# Patient Record
Sex: Male | Born: 1965 | Race: White | Hispanic: No | Marital: Single | Smoking: Never smoker
Health system: Southern US, Community
[De-identification: ages and names within clinical notes are randomized; demographics above are authoritative.]

## PROBLEM LIST (undated history)

## (undated) DIAGNOSIS — K649 Unspecified hemorrhoids: Secondary | ICD-10-CM

## (undated) DIAGNOSIS — I10 Essential (primary) hypertension: Secondary | ICD-10-CM

## (undated) DIAGNOSIS — A048 Other specified bacterial intestinal infections: Secondary | ICD-10-CM

## (undated) DIAGNOSIS — N200 Calculus of kidney: Secondary | ICD-10-CM

## (undated) DIAGNOSIS — F4310 Post-traumatic stress disorder, unspecified: Secondary | ICD-10-CM

## (undated) DIAGNOSIS — F32A Depression, unspecified: Secondary | ICD-10-CM

## (undated) DIAGNOSIS — E785 Hyperlipidemia, unspecified: Secondary | ICD-10-CM

## (undated) DIAGNOSIS — K602 Anal fissure, unspecified: Secondary | ICD-10-CM

## (undated) DIAGNOSIS — K219 Gastro-esophageal reflux disease without esophagitis: Secondary | ICD-10-CM

## (undated) DIAGNOSIS — K279 Peptic ulcer, site unspecified, unspecified as acute or chronic, without hemorrhage or perforation: Secondary | ICD-10-CM

## (undated) DIAGNOSIS — F329 Major depressive disorder, single episode, unspecified: Secondary | ICD-10-CM

## (undated) DIAGNOSIS — D126 Benign neoplasm of colon, unspecified: Secondary | ICD-10-CM

## (undated) DIAGNOSIS — M1990 Unspecified osteoarthritis, unspecified site: Secondary | ICD-10-CM

## (undated) DIAGNOSIS — J189 Pneumonia, unspecified organism: Secondary | ICD-10-CM

## (undated) DIAGNOSIS — M6282 Rhabdomyolysis: Secondary | ICD-10-CM

## (undated) HISTORY — PX: SHOULDER SURGERY: SHX246

## (undated) HISTORY — PX: SPHINCTEROTOMY: SHX5279

## (undated) HISTORY — DX: Hyperlipidemia, unspecified: E78.5

## (undated) HISTORY — DX: Benign neoplasm of colon, unspecified: D12.6

## (undated) HISTORY — PX: NASAL SINUS SURGERY: SHX719

## (undated) HISTORY — PX: HEMORRHOID SURGERY: SHX153

## (undated) HISTORY — DX: Calculus of kidney: N20.0

## (undated) HISTORY — DX: Unspecified osteoarthritis, unspecified site: M19.90

## (undated) HISTORY — DX: Pneumonia, unspecified organism: J18.9

## (undated) HISTORY — DX: Anal fissure, unspecified: K60.2

## (undated) HISTORY — DX: Post-traumatic stress disorder, unspecified: F43.10

## (undated) HISTORY — DX: Unspecified hemorrhoids: K64.9

## (undated) HISTORY — DX: Rhabdomyolysis: M62.82

## (undated) HISTORY — DX: Major depressive disorder, single episode, unspecified: F32.9

## (undated) HISTORY — DX: Depression, unspecified: F32.A

## (undated) HISTORY — DX: Essential (primary) hypertension: I10

## (undated) HISTORY — DX: Peptic ulcer, site unspecified, unspecified as acute or chronic, without hemorrhage or perforation: K27.9

## (undated) HISTORY — DX: Other specified bacterial intestinal infections: A04.8

---

## 1997-03-08 DIAGNOSIS — I219 Acute myocardial infarction, unspecified: Secondary | ICD-10-CM

## 1997-03-08 HISTORY — DX: Acute myocardial infarction, unspecified: I21.9

## 1997-06-19 ENCOUNTER — Ambulatory Visit (HOSPITAL_COMMUNITY): Admission: RE | Admit: 1997-06-19 | Discharge: 1997-06-19 | Payer: Self-pay | Admitting: *Deleted

## 1997-08-01 ENCOUNTER — Emergency Department (HOSPITAL_COMMUNITY): Admission: EM | Admit: 1997-08-01 | Discharge: 1997-08-01 | Payer: Self-pay | Admitting: Emergency Medicine

## 1998-02-02 ENCOUNTER — Emergency Department (HOSPITAL_COMMUNITY): Admission: EM | Admit: 1998-02-02 | Discharge: 1998-02-02 | Payer: Self-pay | Admitting: Emergency Medicine

## 1998-02-02 ENCOUNTER — Encounter: Payer: Self-pay | Admitting: Emergency Medicine

## 1998-03-28 ENCOUNTER — Emergency Department (HOSPITAL_COMMUNITY): Admission: EM | Admit: 1998-03-28 | Discharge: 1998-03-28 | Payer: Self-pay | Admitting: Emergency Medicine

## 1999-02-12 ENCOUNTER — Ambulatory Visit (HOSPITAL_BASED_OUTPATIENT_CLINIC_OR_DEPARTMENT_OTHER): Admission: RE | Admit: 1999-02-12 | Discharge: 1999-02-12 | Payer: Self-pay | Admitting: Plastic Surgery

## 1999-04-10 ENCOUNTER — Encounter: Admission: RE | Admit: 1999-04-10 | Discharge: 1999-07-09 | Payer: Self-pay | Admitting: *Deleted

## 1999-04-14 ENCOUNTER — Emergency Department (HOSPITAL_COMMUNITY): Admission: EM | Admit: 1999-04-14 | Discharge: 1999-04-14 | Payer: Self-pay | Admitting: Emergency Medicine

## 1999-04-14 ENCOUNTER — Encounter: Payer: Self-pay | Admitting: Emergency Medicine

## 1999-09-28 ENCOUNTER — Encounter (INDEPENDENT_AMBULATORY_CARE_PROVIDER_SITE_OTHER): Payer: Self-pay | Admitting: *Deleted

## 1999-09-28 ENCOUNTER — Ambulatory Visit (HOSPITAL_BASED_OUTPATIENT_CLINIC_OR_DEPARTMENT_OTHER): Admission: RE | Admit: 1999-09-28 | Discharge: 1999-09-29 | Payer: Self-pay | Admitting: Otolaryngology

## 2000-01-17 ENCOUNTER — Encounter: Payer: Self-pay | Admitting: Emergency Medicine

## 2000-01-17 ENCOUNTER — Emergency Department (HOSPITAL_COMMUNITY): Admission: EM | Admit: 2000-01-17 | Discharge: 2000-01-17 | Payer: Self-pay | Admitting: Emergency Medicine

## 2000-10-21 ENCOUNTER — Ambulatory Visit (HOSPITAL_COMMUNITY): Admission: RE | Admit: 2000-10-21 | Discharge: 2000-10-21 | Payer: Self-pay | Admitting: Orthopedic Surgery

## 2000-10-21 ENCOUNTER — Encounter: Payer: Self-pay | Admitting: Orthopedic Surgery

## 2001-10-08 ENCOUNTER — Emergency Department (HOSPITAL_COMMUNITY): Admission: EM | Admit: 2001-10-08 | Discharge: 2001-10-08 | Payer: Self-pay

## 2002-03-14 ENCOUNTER — Encounter: Payer: Self-pay | Admitting: Internal Medicine

## 2002-03-14 ENCOUNTER — Inpatient Hospital Stay (HOSPITAL_COMMUNITY): Admission: AD | Admit: 2002-03-14 | Discharge: 2002-03-16 | Payer: Self-pay | Admitting: Internal Medicine

## 2002-05-01 ENCOUNTER — Ambulatory Visit (HOSPITAL_BASED_OUTPATIENT_CLINIC_OR_DEPARTMENT_OTHER): Admission: RE | Admit: 2002-05-01 | Discharge: 2002-05-02 | Payer: Self-pay | Admitting: Surgery

## 2002-12-24 ENCOUNTER — Ambulatory Visit (HOSPITAL_BASED_OUTPATIENT_CLINIC_OR_DEPARTMENT_OTHER): Admission: RE | Admit: 2002-12-24 | Discharge: 2002-12-24 | Payer: Self-pay | Admitting: Plastic Surgery

## 2002-12-24 ENCOUNTER — Ambulatory Visit (HOSPITAL_COMMUNITY): Admission: RE | Admit: 2002-12-24 | Discharge: 2002-12-24 | Payer: Self-pay | Admitting: Plastic Surgery

## 2002-12-24 ENCOUNTER — Encounter (INDEPENDENT_AMBULATORY_CARE_PROVIDER_SITE_OTHER): Payer: Self-pay | Admitting: Specialist

## 2003-05-19 ENCOUNTER — Emergency Department (HOSPITAL_COMMUNITY): Admission: EM | Admit: 2003-05-19 | Discharge: 2003-05-20 | Payer: Self-pay | Admitting: Emergency Medicine

## 2004-02-06 ENCOUNTER — Ambulatory Visit (HOSPITAL_COMMUNITY): Admission: RE | Admit: 2004-02-06 | Discharge: 2004-02-06 | Payer: Self-pay | Admitting: Internal Medicine

## 2004-02-15 ENCOUNTER — Emergency Department (HOSPITAL_COMMUNITY): Admission: EM | Admit: 2004-02-15 | Discharge: 2004-02-15 | Payer: Self-pay | Admitting: Family Medicine

## 2005-07-31 ENCOUNTER — Emergency Department (HOSPITAL_COMMUNITY): Admission: EM | Admit: 2005-07-31 | Discharge: 2005-07-31 | Payer: Self-pay | Admitting: Emergency Medicine

## 2006-07-27 ENCOUNTER — Encounter: Admission: RE | Admit: 2006-07-27 | Discharge: 2006-07-27 | Payer: Self-pay | Admitting: Orthopedic Surgery

## 2006-08-16 ENCOUNTER — Encounter: Admission: RE | Admit: 2006-08-16 | Discharge: 2006-08-16 | Payer: Self-pay | Admitting: Neurological Surgery

## 2007-03-14 ENCOUNTER — Ambulatory Visit: Payer: Self-pay | Admitting: Gastroenterology

## 2007-03-14 LAB — CONVERTED CEMR LAB
AST: 24 units/L (ref 0–37)
Albumin: 3.9 g/dL (ref 3.5–5.2)
Alkaline Phosphatase: 52 units/L (ref 39–117)
BUN: 10 mg/dL (ref 6–23)
Basophils Absolute: 0.1 10*3/uL (ref 0.0–0.1)
Chloride: 105 meq/L (ref 96–112)
Creatinine, Ser: 1 mg/dL (ref 0.4–1.5)
MCHC: 34 g/dL (ref 30.0–36.0)
Monocytes Relative: 9.1 % (ref 3.0–11.0)
Platelets: 309 10*3/uL (ref 150–400)
Potassium: 4.2 meq/L (ref 3.5–5.1)
RBC: 4.77 M/uL (ref 4.22–5.81)
RDW: 13.6 % (ref 11.5–14.6)
Total Bilirubin: 0.6 mg/dL (ref 0.3–1.2)

## 2007-03-16 ENCOUNTER — Ambulatory Visit: Payer: Self-pay | Admitting: Gastroenterology

## 2007-03-16 ENCOUNTER — Encounter: Payer: Self-pay | Admitting: Gastroenterology

## 2007-03-16 ENCOUNTER — Ambulatory Visit (HOSPITAL_COMMUNITY): Admission: RE | Admit: 2007-03-16 | Discharge: 2007-03-17 | Payer: Self-pay | Admitting: Gastroenterology

## 2007-03-29 ENCOUNTER — Ambulatory Visit: Payer: Self-pay | Admitting: Gastroenterology

## 2007-04-23 ENCOUNTER — Ambulatory Visit: Payer: Self-pay | Admitting: Cardiology

## 2007-04-23 ENCOUNTER — Inpatient Hospital Stay (HOSPITAL_COMMUNITY): Admission: EM | Admit: 2007-04-23 | Discharge: 2007-04-26 | Payer: Self-pay | Admitting: Emergency Medicine

## 2007-04-24 ENCOUNTER — Encounter: Payer: Self-pay | Admitting: Cardiology

## 2007-04-27 ENCOUNTER — Observation Stay (HOSPITAL_COMMUNITY): Admission: EM | Admit: 2007-04-27 | Discharge: 2007-04-28 | Payer: Self-pay | Admitting: Emergency Medicine

## 2007-06-09 ENCOUNTER — Emergency Department (HOSPITAL_COMMUNITY): Admission: EM | Admit: 2007-06-09 | Discharge: 2007-06-09 | Payer: Self-pay | Admitting: Family Medicine

## 2008-02-03 ENCOUNTER — Ambulatory Visit: Payer: Self-pay | Admitting: Internal Medicine

## 2008-02-03 ENCOUNTER — Inpatient Hospital Stay (HOSPITAL_COMMUNITY): Admission: EM | Admit: 2008-02-03 | Discharge: 2008-02-06 | Payer: Self-pay | Admitting: Emergency Medicine

## 2008-03-21 ENCOUNTER — Ambulatory Visit: Payer: Self-pay | Admitting: Cardiovascular Disease

## 2008-04-02 ENCOUNTER — Telehealth: Payer: Self-pay | Admitting: Gastroenterology

## 2008-04-03 ENCOUNTER — Ambulatory Visit: Payer: Self-pay | Admitting: Internal Medicine

## 2008-04-03 LAB — CONVERTED CEMR LAB
CO2: 31 meq/L (ref 19–32)
Calcium: 9.2 mg/dL (ref 8.4–10.5)
Glucose, Bld: 106 mg/dL — ABNORMAL HIGH (ref 70–99)
Sodium: 141 meq/L (ref 135–145)

## 2008-06-10 DIAGNOSIS — K279 Peptic ulcer, site unspecified, unspecified as acute or chronic, without hemorrhage or perforation: Secondary | ICD-10-CM | POA: Insufficient documentation

## 2008-06-10 DIAGNOSIS — I10 Essential (primary) hypertension: Secondary | ICD-10-CM | POA: Insufficient documentation

## 2008-06-10 DIAGNOSIS — F329 Major depressive disorder, single episode, unspecified: Secondary | ICD-10-CM | POA: Insufficient documentation

## 2008-06-10 DIAGNOSIS — M6282 Rhabdomyolysis: Secondary | ICD-10-CM | POA: Insufficient documentation

## 2008-06-10 DIAGNOSIS — F3289 Other specified depressive episodes: Secondary | ICD-10-CM | POA: Insufficient documentation

## 2008-06-10 DIAGNOSIS — Z87442 Personal history of urinary calculi: Secondary | ICD-10-CM | POA: Insufficient documentation

## 2008-06-10 DIAGNOSIS — Z8679 Personal history of other diseases of the circulatory system: Secondary | ICD-10-CM | POA: Insufficient documentation

## 2008-06-10 DIAGNOSIS — E782 Mixed hyperlipidemia: Secondary | ICD-10-CM | POA: Insufficient documentation

## 2008-06-10 DIAGNOSIS — F4310 Post-traumatic stress disorder, unspecified: Secondary | ICD-10-CM | POA: Insufficient documentation

## 2008-06-10 DIAGNOSIS — E785 Hyperlipidemia, unspecified: Secondary | ICD-10-CM | POA: Insufficient documentation

## 2008-06-13 ENCOUNTER — Encounter: Payer: Self-pay | Admitting: Cardiovascular Disease

## 2008-06-13 ENCOUNTER — Ambulatory Visit: Payer: Self-pay | Admitting: Cardiovascular Disease

## 2008-12-16 ENCOUNTER — Ambulatory Visit: Payer: Self-pay | Admitting: Cardiovascular Disease

## 2009-02-18 ENCOUNTER — Telehealth: Payer: Self-pay | Admitting: Cardiovascular Disease

## 2009-02-21 ENCOUNTER — Ambulatory Visit: Payer: Self-pay | Admitting: Cardiovascular Disease

## 2009-02-26 LAB — CONVERTED CEMR LAB
BUN: 5 mg/dL — ABNORMAL LOW (ref 6–23)
Basophils Absolute: 0.1 10*3/uL (ref 0.0–0.1)
Chloride: 102 meq/L (ref 96–112)
Eosinophils Absolute: 0.3 10*3/uL (ref 0.0–0.7)
GFR calc non Af Amer: 129.2 mL/min (ref >60)
Glucose, Bld: 102 mg/dL — ABNORMAL HIGH (ref 70–99)
HCT: 36.6 % — ABNORMAL LOW (ref 39.0–52.0)
Lymphs Abs: 1.2 10*3/uL (ref 0.7–4.0)
MCHC: 33.6 g/dL (ref 30.0–36.0)
MCV: 86.9 fL (ref 78.0–100.0)
Monocytes Absolute: 0.3 10*3/uL (ref 0.1–1.0)
Platelets: 208 10*3/uL (ref 150.0–400.0)
Potassium: 3.7 meq/L (ref 3.5–5.1)
RDW: 13.9 % (ref 11.5–14.6)

## 2009-04-10 ENCOUNTER — Emergency Department (HOSPITAL_COMMUNITY): Admission: EM | Admit: 2009-04-10 | Discharge: 2009-04-10 | Payer: Self-pay | Admitting: Family Medicine

## 2009-06-12 ENCOUNTER — Ambulatory Visit: Payer: Self-pay | Admitting: Cardiovascular Disease

## 2009-11-04 ENCOUNTER — Ambulatory Visit (HOSPITAL_COMMUNITY): Admission: RE | Admit: 2009-11-04 | Discharge: 2009-11-04 | Payer: Self-pay | Admitting: Neurological Surgery

## 2009-11-05 ENCOUNTER — Ambulatory Visit (HOSPITAL_COMMUNITY): Admission: RE | Admit: 2009-11-05 | Discharge: 2009-11-05 | Payer: Self-pay | Admitting: Neurological Surgery

## 2009-11-18 ENCOUNTER — Telehealth (INDEPENDENT_AMBULATORY_CARE_PROVIDER_SITE_OTHER): Payer: Self-pay | Admitting: *Deleted

## 2010-01-01 ENCOUNTER — Encounter: Payer: Self-pay | Admitting: Internal Medicine

## 2010-01-01 ENCOUNTER — Ambulatory Visit: Payer: Self-pay | Admitting: Cardiovascular Disease

## 2010-04-07 NOTE — Progress Notes (Signed)
Summary: Med List  Med List   Imported By: Roderic Ovens 06/17/2009 16:12:41  _____________________________________________________________________  External Attachment:    Type:   Image     Comment:   External Document

## 2010-04-07 NOTE — Progress Notes (Signed)
Summary: Med List   Med List   Imported By: Roderic Ovens 01/09/2010 12:43:18  _____________________________________________________________________  External Attachment:    Type:   Image     Comment:   External Document

## 2010-04-07 NOTE — Progress Notes (Signed)
Summary: refill request  Phone Note Refill Request Message from:  Patient on November 18, 2009 10:31 AM  Refills Requested: Medication #1:  CLONIDINE HCL 0.2 MG/24HR PTWK APPLY 1 PATCH ONCE PER WEEK. [facility]   Method Requested: Telephone to Pharmacy Initial call taken by: Glynda Jaeger,  November 18, 2009 10:32 AM  Follow-up for Phone Call        Rx faxed to pharmacy. Vikki Ports  November 18, 2009 11:01 AM     Prescriptions: CLONIDINE HCL 0.2 MG/24HR PTWK (CLONIDINE HCL) APPLY 1 PATCH ONCE PER WEEK  #12 x 3   Entered by:   Vikki Ports   Authorized by:   Norva Karvonen, MD   Signed by:   Vikki Ports on 11/18/2009   Method used:   Faxed to ...       [facility]* (retail)       [address]       [address] Shipping/mailing       [place], [place]  [address]       Ph: [phone]       Fax: [phone]   RxID:   [id number]

## 2010-04-07 NOTE — Assessment & Plan Note (Signed)
Summary: 6 month rov/sl  Medications Added NIACIN CR 500 MG CR-TABS (NIACIN) take 6 tablets two times a day LABETALOL HCL 200 MG TABS (LABETALOL HCL) Take 1 tablet by mouth two times a day NEXIUM 40 MG CPDR (ESOMEPRAZOLE MAGNESIUM) Take 1 tablet by mouth two times a day ASPIRIN 81 MG TBEC (ASPIRIN) Take one tablet by mouth daily CLONIDINE HCL 0.2 MG/24HR PTWK (CLONIDINE HCL) APPLY 1 PATCH ONCE PER WEEK        Visit Type:  6 months follow up Primary Provider:  Dr Renae Gloss  CC:  Rapid heart beat- High blood pressure.  History of Present Illness:  This is a 45 year old gentleman with hypertension, familial dyslipidemia, and history of chest pain. He underwent a cardiac catheterization in 2009 showing essentially normal coronary arteries. He developed hypotension on an angiotensin receptor blocker and this was discontinued because of the near syncopal episode. He has been intolerant to ACE inhibitors because of cough. He has tolerated a combination of clonidine and labetalol. He still reports swings in his blood pressure but overall he is tolerating this combination fairly well. He denies syncope. He denies chest pain or dyspnea. 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  Current Medications (verified): 1)  Sertraline Hcl 50 Mg Tabs (Sertraline Hcl) .... Take 1 Tablet By Mouth Once A Day 2)  Amitriptyline Hcl 75 Mg Tabs (Amitriptyline Hcl) .... Take 1 Tablet By Mouth Once A Day 3)  Clonidine Hcl 0.2 Mg Tabs (Clonidine Hcl) .... Take 2 Tablets Two Times A Day 4)  Niacin Cr 500 Mg Cr-Tabs (Niacin) .... Take 6 Tablets Two Times A Day 5)  Labetalol Hcl 200 Mg Tabs (Labetalol Hcl) .... Take 1 Tablet By Mouth Two Times A Day 6)  Nexium 40 Mg Cpdr (Esomeprazole Magnesium) .... Take 1 Tablet By Mouth Two Times A Day 7)  Oxycodone-Acetaminophen 5-325 Mg Tabs (Oxycodone-Acetaminophen) .... 1/2 Tablet [p.o. At  Bedtime As Needed 8)  Aspirin 81 Mg Tbec (Aspirin) .... Take One Tablet By Mouth Daily  Allergies: 1)  ! Benadryl 2)  ! * Statins 3)  ! * Fibrates 4)  ! * Latex  Past History:  Past medical history reviewed for relevance to current acute and chronic problems.  Past Medical History: Reviewed history from 12/16/2008 and no changes required. CHEST PAIN, HX OF (ICD-V12.50) with no obstructive coronary artery disease at catheter in 2009 HYPERTENSION (ICD-401.9) HYPERLIPIDEMIA (ICD-272.4) RENAL CALCULUS, HX OF (ICD-V13.01) DEPRESSION (ICD-311) RHABDOMYOLYSIS (ICD-728.88) POST TRAUMATIC STRESS SYNDROME (ICD-309.81) PEPTIC ULCER DISEASE (ICD-533.90)  Vital Signs:  Patient profile:   45 year old male Height:      71 inches Weight:      194 pounds BMI:     27.16 Pulse rate:   81 / minute Pulse rhythm:   regular Resp:     18 per minute BP sitting:   110 / 80  (left arm) Cuff size:   large  Vitals Entered By: Vikki Ports (June 12, 2009 4:18 PM)  Physical Exam  General:  Pt is alert and oriented, in no acute distress. HEENT: normal Neck: normal carotid upstrokes without bruits, JVP normal Lungs: CTA CV: RRR without murmur or gallop Abd: soft, NT, positive BS, no bruit, no organomegaly Ext: no clubbing, cyanosis, or edema. peripheral pulses 2+ and equal Skin: warm and dry without rash    EKG  Procedure date:  06/12/2009  Findings:  Normal sinus rhythm, heart rate 81 beats per minute, within normal limits.  Impression & Recommendations:  Problem # 1:  HYPERTENSION (ICD-401.9) Recommend trial of a clonidine patch. This may give him more consistent dosing of his clonidine and resulted in fewer swings in his blood pressure. He was written a prescription for clonidine patch 0.2 mg.  The following medications were removed from the medication list:    Clonidine Hcl 0.1 Mg Tabs (Clonidine hcl) .Marland Kitchen... 1 tab two times a day His updated medication list for this problem  includes:    Labetalol Hcl 200 Mg Tabs (Labetalol hcl) .Marland Kitchen... Take 1 tablet by mouth two times a day    Aspirin 81 Mg Tbec (Aspirin) .Marland Kitchen... Take one tablet by mouth daily    Clonidine Hcl 0.2 Mg/24hr Ptwk (Clonidine hcl) .Marland Kitchen... Apply 1 patch once per week  Problem # 2:  HYPERLIPIDEMIA (ICD-272.4) Most recent lipid panel showed a total cholesterol of 274, LDL 186, HDL 72, and triglycerides 79. This represents a big improvement from his past levels. He is tolerating high doses of niacin. We have not use statins or fibrillates because of his history of rhabdomyolysis. His updated medication list for this problem includes:    Niacin Cr 500 Mg Cr-tabs (Niacin) .Marland Kitchen... Take 6 tablets two times a day  Patient Instructions: 1)  Your physician recommends that you schedule a follow-up appointment in: 6 MONTHS 2)  Your physician has recommended you make the following change in your medication: PLEASE SWITCH TO CLONODINE PATCH 0.2MG   ONE PER WEEK Prescriptions: CLONIDINE HCL 0.2 MG/24HR PTWK (CLONIDINE HCL) APPLY 1 PATCH ONCE PER WEEK  #12 x 3   Entered by:   Ledon Snare, RN   Authorized by:   Norva Karvonen, MD   Signed by:   Ledon Snare, RN on 06/12/2009   Method used:   Electronically to        [facility]* (retail)       [address]       [address] Shipping/mailing       [place], [place]  [address]       Ph: [phone]       Fax: [phone]   RxID:   [id number]

## 2010-04-07 NOTE — Assessment & Plan Note (Signed)
Summary: F/U 6 MONTHS   Visit Type:  6 months follow up Primary Provider:  Dr Renae Gloss  CC:  No complaints.  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 He underwent a cardiac catheterization in 2009 showing essentially normal coronary arteries. He developed hypotension on an angiotensin receptor blocker and this was discontinued because of the near syncopal episode. He has been intolerant to ACE inhibitors because of cough. He has tolerated a combination of clonidine and labetalol.  His cholesterol is followed by his PCP.  He has had rhabdo with tricor and was intolerant to statins.  He is on high dose niacin and has been seen at the [facility] in the past.   He checks his BP when he feels like it is going up.  He states last 4 mos numbers have been good with diastolics in 70s to 100s.  No meds today.  Takes meds at noon.   Since last visit, he denies any chest pain, dyspnea, syncope or edema.  Functionally, he is NYHA class 2.    Current Medications (verified): 1)  Sertraline Hcl 100 Mg Tabs (Sertraline Hcl) .... Take 1 Tablet By Mouth Once A Day 2)  Amitriptyline Hcl 75 Mg Tabs (Amitriptyline Hcl) .... Take 1 Tablet By Mouth Once A Day 3)  Niacin Cr 500 Mg Cr-Tabs (Niacin) .... 3.000mg  Two Times A Day 4)  Labetalol Hcl 200 Mg Tabs (Labetalol Hcl) .... Take 1 Tablet By Mouth Two Times A Day 5)  Nexium 40 Mg Cpdr (Esomeprazole Magnesium) .... Take 1 Tablet By Mouth Two Times A Day 6)  Oxycodone-Acetaminophen 5-325 Mg Tabs (Oxycodone-Acetaminophen) .... 1/2 Tablet [p.o. At Bedtime As Needed 7)  Aspirin 81 Mg Tbec (Aspirin) .... Take One Tablet By Mouth Daily 8)  Clonidine Hcl 0.2 Mg/24hr Ptwk (Clonidine Hcl) .... Apply 1 Patch Once Per Week 9)  Claritin-D 12 Hour 5-120 Mg Xr12h-Tab (Loratadine-Pseudoephedrine) .... As Needed 10)  Erythromycin 2 % Gel (Erythromycin) .... As  Needed  Allergies: 1)  ! Benadryl 2)  ! * Statins 3)  ! * Fibrates 4)  ! * Latex  Past History:  Past medical history reviewed for relevance to current acute and chronic problems.  Past Medical History: CHEST PAIN, HX OF (ICD-V12.50) with no obstructive coronary artery disease at catheter in 2009 HYPERTENSION (ICD-401.9) HYPERLIPIDEMIA (ICD-272.4) RENAL CALCULUS, HX OF (ICD-V13.01) DEPRESSION (ICD-311) RHABDOMYOLYSIS (ICD-728.88) in setting of statin use POST TRAUMATIC STRESS SYNDROME (ICD-309.81) PEPTIC ULCER DISEASE (ICD-533.90)  Past Surgical History: Sinusitis, status post surgery in 2001.  Left shoulder,  Hemorhoidectomy .  Review of Systems       Negative except as per HPI   Vital Signs:  Patient profile:   45 year old male Height:      71 inches Weight:      187.50 pounds BMI:     26.25 Pulse rate:   97 / minute Pulse rhythm:   regular Resp:     18 per minute BP sitting:   131 / 90  (left arm) Cuff size:   large  Vitals Entered By: Vikki Ports (January 01, 2010 11:39 AM)  Physical Exam  General:  Well nourished, well developed, in no acute distress HEENT: normal Neck: no JVD Cardiac:  normal S1, S2; RRR; no murmur Lungs:  clear to auscultation bilaterally, no wheezing, rhonchi or rales Abd: soft, nontender, no hepatomegaly Ext: no clubbing, cyanosis or edema Vascular:  no carotid  bruits; DP/PT 2+ bilaterally Skin: warm and dry    EKG  Procedure date:  01/01/2010  Findings:      NSR, HR 97, no ischemic changes.  Impression & Recommendations:  Problem # 1:  HYPERTENSION (ICD-401.9) Doing well on combination of clonidine patch and labetalol. Diastolic up today but he usually takes his medicines at noon.  Problem # 2:  CHEST PAIN, HX OF (ICD-V12.50)  Negative cath in 2009.  Orders: EKG w/ Interpretation (93000)  Problem # 3:  HYPERLIPIDEMIA (ICD-272.4) Followed by Dr. Renae Gloss.  His updated medication list for this problem  includes:    Niacin Cr 500 Mg Cr-tabs (Niacin) .Marland Kitchen... 3.000mg  two times a day  Patient Instructions: 1)  Your physician recommends that you schedule a follow-up appointment in: 1 year 2)  Your physician recommends that you continue on your current medications as directed. Please refer to the Current Medication list given to you today.

## 2010-05-29 LAB — POCT RAPID STREP A (OFFICE): Streptococcus, Group A Screen (Direct): NEGATIVE

## 2010-07-21 NOTE — Assessment & Plan Note (Signed)
[facility]                            CARDIOLOGY OFFICE NOTE   [name]                      MRN:          [id number]  DATE:03/21/2008                            DOB:          Jul 07, 1965    REASON FOR VISIT:  Hospital followup.   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  Dr.  Gala Romney and I have both taken care of him in the hospital.  He was  admitted last year with rhabdomyolysis related to TriCor usage.  He was  admitted in November with chest pain and underwent cardiac  catheterization demonstrating no significant CAD.  He has hyperlipidemia  and is intolerant to STATINS and FIBRATES.  He has been seen by Dr.  Dorinda Hill at [facility] and he is tolerating a high-dose niacin.  He denies  further chest pain, dyspnea, edema, or palpitations.   MEDICATIONS:  1. Sertraline 50 mg daily.  2. Amitriptyline 75 mg daily.  3. Clonidine 0.2 mg twice daily.  4. Niacin 2500 mg twice daily.  5. Aspirin 325 mg twice daily.  6. Labetalol 200 mg twice daily.  7. Nexium 40 mg daily.  8. Oxycodone with acetaminophen 5/325 mg 1-2 at bedtime.  9. Flaxseed oil 1 g daily.  10.Fish oil 1 g daily.   ALLERGIES:  STATINS, FIBRATES, BENADRYL, and LATEX.   PHYSICAL EXAMINATION:  GENERAL:  The patient is alert and oriented.  He  is in no distress.  VITAL SIGNS:  Weight is 190 pounds, blood pressure 130/90, heart rate  74, and respiratory rates 12.  HEENT:  Normal.  NECK:  Normal carotid upstrokes.  No bruits.  JVP normal.  LUNGS:  Clear bilaterally.  HEART:  Regular rate and rhythm.  No murmurs or gallops.  ABDOMEN:  Soft and nontender.  No organomegaly.  EXTREMITIES:  No clubbing, cyanosis, or edema.  Peripheral pulses are  intact and equal.   EKG shows sinus rhythm and is within normal limits.   ASSESSMENT:  1. Hyperlipidemia.  The patient is followed by Dr.  Lang Snow.  Treated      with high-dose niacin and is tolerating this amazingly well.  2. Hypertension.  He reports diastolic blood pressures frequently      greater than 90.  He is currently on clonidine and labetalol.  We      will add lisinopril 10 mg daily and followup with a metabolic panel      in a few weeks.  3. History of chest pain.  Normal coronaries by catheterization as      outlined above.  4. I set him up to follow up with either me or Dr. Gala Romney in about      3 months.     Veverly Fells. Excell Seltzer, MD  Electronically Signed    MDC/MedQ  DD: 03/22/2008  DT: 03/23/2008  Job #: [id number]   cc:   Merlene Laughter. Renae Gloss, M.D.  Bevelyn Buckles. Bensimhon, MD

## 2010-07-21 NOTE — Discharge Summary (Signed)
[name], [name]               ACCOUNT NO.:  [id number]   MEDICAL RECORD NO.:  [id number]          PATIENT TYPE:  INP   LOCATION:  4702                         FACILITY:  [facility]   PHYSICIAN:  Ladell Pier, M.D.   DATE OF BIRTH:  23-Jan-1966   DATE OF ADMISSION:  04/23/2007  DATE OF DISCHARGE:  04/26/2007                               DISCHARGE SUMMARY   DISCHARGE DIAGNOSES:  1. Rhabdomyolysis.  2. Presyncope.  3. Abnormal liver function test with negative hepatitis C and      hepatitis B panel.  4. Dyslipidemia, previously on Tricor.  5. Hypertension.  6. Mood disorder.  7. Hypokalemia.  8. Anemia.   DISCHARGE MEDICATIONS:  1. Zoloft 50 mg daily.  2. Percocet p.r.n.  3. Allegra D 180 mg p.r.n.  4. Amitriptyline 35 mg q.h.s.  5. Protonix 40 mg daily.   FOLLOWUP APPOINTMENT:  The patient is to follow up with Dr. Renae Gloss in  one to two days for CK levels and liver function tests.   PROCEDURES:  None.   CONSULTANTS:  The patient was initially seen by cardiology.   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  Admitted to the hospital  secondary to fatigue, shortness of breath and episodes of weakness and  dizziness resulting in fall without frank syncope.  He has had no chest  pain and denies any recent injuries.  He has a history of high  cholesterol with a history of elevated  liver function test secondary to  Statin medications. He was recently started on Tricor.  He has no  history of hypertension or diabetes.  Please see admission note for  remainder of the history.   PAST MEDICAL HISTORY, FAMILY HISTORY, SOCIAL HISTORY, MEDS, ALLERGIES,  REVIEW OF SYSTEMS:  Per admission H and P.   PHYSICAL EXAMINATION:  VITAL SIGNS: On discharge, temperature 98.1.  Pulse of 55. Respirations 18. Blood pressure 101/65. Pulse ox of 95% on  room air.  HEENT: Head is normocephalic atraumatic. Pupils are reactive to  light.  Throat without erythema.  CARDIOVASCULAR: Regular rate and rhythm.  LUNGS: Clear bilaterally.  ABDOMEN: Positive bowel sounds.  EXTREMITIES: No edema.   HOSPITAL COURSE:  1. Near syncope: The patient was initially admitted to the cardiology      service for cardiac workup. It turned out that his enzymes were      normal, except for a CK being elevated showing a rhabdo.  The      patient's symptoms were thought to be not secondary to heart, but      most likely secondary to rhabdo and dehydration. The patient was      transferred to the internal  medicine service.  2. Rhabdomyolysis: As mentioned above, the patient developed      rhabdomyolysis most likely secondary to Tricor. The Tricor was      discontinued. He was given IV fluids.  He will follow up with Dr.      Renae Gloss within one to two days for CK levels check as he wants  to      go home today.  He will get his levels checked on Friday.      Encouraged him to drink plenty of fluids in the mean time. He has      muscle aches at this present time.  Discussed with him that he may      need to see a rheumatologist if his enzymes does not trend down      with the discontinuation of his Tricor.  3. Abnormal liver function tests: Most likely secondary to Statim      medications and the rhabdo. Will continue to monitor. Will get      results checked on an outpatient basis.  4. Mood disorder: Continue him on his home medications.  5. Hypertension: Continue home medication. That has been stable.  6. Hypokalemia that was repleted.   DISCHARGE LABS:  CK 20,108. Troponin 0.01. Potassium level 3.8. AST 308.  ALT 148. Hepatitis C negative. Hepatitis B negative. ESR 8. HIV  negative.  TSH 2.250. CK levels initially were 40,431.  D-Dimer 1.49. CT  negative for PE. Area of subsegmental atelectasis/scar.      Ladell Pier, M.D.  Electronically Signed     NJ/MEDQ  D:  04/26/2007  T:  04/27/2007  Job:  [id number]   cc:   Merlene Laughter.  Renae Gloss, M.D.

## 2010-07-21 NOTE — H&P (Signed)
[name], [name]               ACCOUNT NO.:  [id number]   MEDICAL RECORD NO.:  [id number]          PATIENT TYPE:  EMS   LOCATION:  MAJO                         FACILITY:  [facility]   PHYSICIAN:  Altha Harm, MDDATE OF BIRTH:  [birth year]   DATE OF ADMISSION:  04/27/2007  DATE OF DISCHARGE:                              HISTORY & PHYSICAL   CHIEF COMPLAINT:  Markedly elevated blood pressure.   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  Please note:  The patient was recently  admitted with rhabdomyolysis and was discharged yesterday to follow up  with is primary care physician today.  The patient was seen by his  primary care physician and found to have blood pressure elevated at  200/116.  Based upon this, his primary care physician sent him to the  hospital for further evaluation.  In the emergency room, the patient had  blood pressure ranging 167/106 with heart rate of 117.  We are asked to  see the patient for admission for hypertensive urgency.  Incidentally,  the patient had CK evaluation at that time and found to be markedly  improved from yesterday.  Thus, as far as the diagnosis of  rhabdomyolysis, the patient is improving and would not need admission  for this.  However, based upon the fact that the patient does have a  hypertensive urgency, this is the basis for which we are admitting the  patient.  The patient states that he has no prior history of  hypertension, and it is noted in his records that while hospitalized, he  had no trend of hypertension during his hospitalization.  The patient  has never been on any medications.  However, he states that he has at  times felt the same sensation of having a headache and feeling somewhat  dizzy. He states that today, when his blood pressure was noted to be  elevated, he had what he describes as a  pounding headache and some  dizziness associated with it.  The patient describes the headache as  being about a 6/10; however, at this point during the evaluation, denies  any headache right now.  In the emergency room, the patient had not yet  received any medications to lower his blood pressure.   PAST MEDICAL HISTORY:  Significant for:  1. Rhabdomyolysis.  2. Posttraumatic stress disorder.  3. History of pneumonia in the past.  4. History of sinusitis.  5. History of hemorrhoids.  6. History of hyperlipidemia.  7. History of depression.  8. History of sphincterotomy.   FAMILY HISTORY:  Significant for hypertension.   SOCIAL HISTORY:  He is employed at [employer].  He lives alone.  There is no alcohol, tobacco, or drug abuse noted.  He is a single  gentleman.   ALLERGIES:  1  STATINS. and in particular TRICOR.  1. BENADRYL.  2. LASIX.   CURRENT MEDICATIONS:  1. Zoloft 50 mg p.o. daily.  2. Percocet 10/325 q. 6  h p.r.n.  3. Allegra D 180 daily p.r.n.  4. Elavil 35 mg p.o. nightly.  5. Protonix 40 mg p.o. daily.   PRIMARY CARE PHYSICIAN:  Merlene Laughter. Renae Gloss, M.D.   REVIEW OF SYSTEMS:  Fourteen systems were reviewed.  All systems  negative except as noted in the HPI.   In the emergency room, studies done showed the following.  Sodium 139,  potassium 3.4, chloride 106, bicarb 23, BUN 6, creatinine 0.97.  AST  258, ALT 210.  CK 12,070 which is down from 20,108 from yesterday.  CK-  MB 4.9.  White blood cell count 6.1, hemoglobin 13.8, hematocrit 41,  platelet count 347.   PHYSICAL EXAMINATION:  GENERAL:  The patient is lying in bed in no acute  distress.  VITAL SIGNS:  His initial blood pressure is 167/106, current blood  pressure 159/94.  Current heart rate 80, respiratory rate 16, O2  saturation 99% on room air.  HEENT:  The patient is normocephalic and atraumatic.  Pupils equal,  round, and reactive to light and accommodation.  Fundi benign.  There is  no  evidence of retinal hemorrhage noted.  Tympanic membranes translucent  with good landmarks.  Oropharynx moist.  No exudate, erythema or lesions  are noted.  NECK:  Trachea is midline.  No masses, no thyromegaly, no JVD, no  carotid bruit noted.  RESPIRATORY:  The patient has a normal respiratory effort, equal  excursion bilaterally.  No wheezing, no rhonchi or rales noted.  No  increased fremitus noted.  CARDIOVASCULAR:  Normal S1 and S2.  No murmurs, rubs, or gallops noted.  PMI is not displaced.  No heaves or thrills on palpation.  ABDOMEN:  The patient has an obese abdomen.  Soft, nontender,  nondistended.  No masses, no hepatosplenomegaly noted.  No bruits, no  striae noted.  LYMPH NODE SURVEY:  No cervical,  axillary, or inguinal lymphadenopathy  noted.  MUSCULOSKELETAL:  No warmth, swelling, or erythema around the joints.  There is no spinal tenderness.  There is no CVA tenderness.  NEUROLOGIC:  The patient has no focal neurological deficits.  Cranial  nerves II-XII grossly intact.  Strength is 4+/5 bilaterally upper and  lower extremities.  DTRs 2+ bilateral upper and lower extremities.  Sensation is intact to light touch and proprioception.  PSYCHIATRIC:  The patient is alert and oriented x3.  Appears to have  good insight and cognition to recent and remote recall.   ASSESSMENT AND PLAN:  This is a patient who present with  1. Hypertensive urgency.  His blood pressure seems to be improving.      The patient will be admitted on observation basis and started on      labetalol.  The patient's blood pressure will be monitored.  If      they continue to trend down as they seem to have already started      to, it is likely the patient can be transferred home tomorrow.  2. Rhabdomyolysis.  The patient's CKs are trending down nicely.  Will      continue to hydrate the patient and follow up CK prior to      discharge.   The patient will be restarted on his usual medications and will  be given  DVT  and GI prophylaxes with Lovenox and Protonix, respectively.      Altha Harm, MD  Electronically Signed     MAM/MEDQ  D:  04/27/2007  T:  04/27/2007  Job:  [id number]   cc:   Merlene Laughter. Renae Gloss, M.D.

## 2010-07-21 NOTE — Cardiovascular Report (Signed)
[name], [name]               ACCOUNT NO.:  [id number]   MEDICAL RECORD NO.:  [id number]          PATIENT TYPE:  INP   LOCATION:  3730                         FACILITY:  [facility]   PHYSICIAN:  Arturo Morton. Riley Kill, MD, FACCDATE OF BIRTH:  1966-02-01   DATE OF PROCEDURE:  02/05/2008  DATE OF DISCHARGE:                            CARDIAC CATHETERIZATION   INDICATIONS:  [name] is a 45 year old known to [place] through his work at  [employer].  He has some marked hyperlipidemia.  He is followed  by Dr. Eligah East at [facility].  He developed chest pain and was admitted for  further evaluation.  Informed consent was obtained.   PROCEDURE:  1. Left heart catheterization.  2. Selective coronary arteriography.  3. Selective left ventriculography.   DESCRIPTION OF PROCEDURE:  Through an anterior puncture, the right  femoral artery was easily entered on the first stick.  A 5-French sheath  was placed.  Views of the left and right coronary arteries were  obtained.  Multiple views of the left coronary artery particularly were  obtained because of overlap of the intermediate proximal LAD.  We were  eventually able to see all of the branches.  There were no major  complications.  Central aortic and left ventricular pressures were  measured with pigtail and ventriculography was performed in the RAO  projection.  He tolerated the procedure well and was taken to the  holding area for sheath removal.  There was no family available.   HEMODYNAMIC DATA:  1. The central aortic pressure was 121/85, mean 103.  2. Left ventricular pressure 128/10.  3. There was no gradient pullback across the aortic valve.   ANGIOGRAPHIC DATA:  1. Ventriculography was done in the RAO projection.  There appeared to      be very mild global hypokinesis, it did not appear to be severe.      Estimated ejection fraction is in the 50-55% range.  2. The left main is fairly short and trifurcates into an LAD, ramus,      and an  AV circumflex.  The AV circumflex itself is a codominant      system.  The left main and trifurcation are without critical      disease.  3. The left anterior descending artery courses to the apex and      provides a couple of small diagonal branches.  No definite      angiographic abnormalities are seen or high obstruction observed.  4. The ramus intermedius is a large bifurcating vessel without      significant disease.  5. The AV circumflex provides a moderate-sized marginal branch,      posterolateral branch, and then a co-PDA.  This appears without      critical narrowing.  6. The right coronary artery is a moderate-sized vessel with a single      PDA.  It is without significant disease.   CONCLUSION:  1. No significant high-grade focal obstruction.  2. Low normal ejection fraction.   DISPOSITION:  1. Treatment of hypertension.  2. Treatment of hyperlipidemia.  3. Followup  with Dr. Excell Seltzer.      Arturo Morton. Riley Kill, MD, [facility]  Electronically Signed     TDS/MEDQ  D:  02/05/2008  T:  02/06/2008  Job:  [id number]   cc:   CV Laboratory  Veverly Fells. Excell Seltzer, MD  Bevelyn Buckles. Bensimhon, MD

## 2010-07-21 NOTE — H&P (Signed)
[name], [name]               ACCOUNT NO.:  [id number]   MEDICAL RECORD NO.:  [id number]          PATIENT TYPE:  INP   LOCATION:  2921                         FACILITY:  [facility]   PHYSICIAN:  Jonelle Sidle, MD DATE OF BIRTH:  [birth year]   DATE OF ADMISSION:  04/23/2007  DATE OF DISCHARGE:                              HISTORY & PHYSICAL   ADDENDUM:   PRIMARY CARE PHYSICIAN:  Merlene Laughter. Renae Gloss, M.D.   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  He is admitted  to our service with recent complaints of general fatigue, shortness of  breath and episodic weakness and dizziness, actually resulting in some  falls without frank syncope.  He has had no chest pain and denies any  recent injuries.  He does state that he has noted that his heart rate  has been elevated in general, although has had no frank sense of  palpitations.  His history includes hyperlipidemia with previous  transaminase elevations in the setting of statin medication use,  although most recently he has been on Tricor.  He has no personal  history of hypertension, diabetes mellitus or cardiovascular disease.   He was seen initially in the emergency department, at which time he was  noted to be in sinus tachycardia and relatively hypertensive.  He had  nonspecific changes on his electrocardiogram, and was not hypoxic or in  any acute respiratory distress.  He was noted to have an abnormal D-  dimer level of 1.48, resulting in the CT scan of the chest which  demonstrated no obvious pulmonary embolus.  He also had significantly  abnormal point of care cardiac markers, including an increased CK-MB of  43 and a myoglobin of greater than 500, although with a normal troponin  I level of 0.05.  Urine drug screen demonstrated no abnormalities  (positive for tricyclics, which the patient takes as an outpatient).  We  were contacted for  evaluation and admission.   The patient has been admitted to the step-down unit with the initial  plan to rule out obvious acute coronary syndrome based on follow up  serial markers, and also further evaluation of transaminase elevation  which was also incidentally noted at presentation.  An echocardiogram  was planned to exclude cardiomyopathy.  Ultimately, follow up cardiac  markers returned markedly abnormal, specifically relating to an elevated  total CK level greater than 40,000 with a CK-MB level of 86, and again a  normal troponin I level of 0.01.  This is clearly most consistent with  rhabdomyolysis.   I discussed this with the resident on call with our team, and the plan  at this point is for aggressive hydration with involvement of nephrology  if the patient shows any evidence of drop-off in urine output.  At the  present time, the patient's potassium is 3.3, and BUN and creatinine are  7 and 1.0 respectively.  Urinalysis was also sent showing an amber-  colored urine that was cloudy in appearance, a small amount of  bilirubin, a large  amount of blood, 30 protein and some granular casts.  The patient is to be taken off of full-dose Lovenox, placed on deep vein  thrombosis prophylaxis dose, and otherwise continued on aspirin for the  time being.  The next step will be determining the etiology of the  patient's rhabdomyolysis in the absence of any obvious culprit  medications (no recent statins), no  major muscle injury, and no other obvious toxins.  Other potential viral  etiologies could be considered.  This may also explain the patient's  abnormal transaminases.  Clearly, further evaluation will be necessary,  although at this point a cardiac etiology is quite unlikely.  Further  plans to follow.      Jonelle Sidle, MD  Electronically Signed     SGM/MEDQ  D:  04/23/2007  T:  04/23/2007  Job:  [id number]   cc:   Merlene Laughter. Renae Gloss, M.D.

## 2010-07-21 NOTE — Discharge Summary (Signed)
[name], [name]               ACCOUNT NO.:  [id number]   MEDICAL RECORD NO.:  [id number]          PATIENT TYPE:  INP   LOCATION:  3730                         FACILITY:  [facility]   PHYSICIAN:  Bevelyn Buckles. Bensimhon, MDDATE OF BIRTH:  1965/04/27   DATE OF ADMISSION:  02/03/2008  DATE OF DISCHARGE:  02/06/2008                               DISCHARGE SUMMARY   DISCHARGING PHYSICIAN:  Bevelyn Buckles. Bensimhon, MD   PRIMARY CARE PHYSICIAN:  Merlene Laughter. Renae Gloss, MD.  The patient is also  followed by Dr. Eligah East at [facility].   DISCHARGING DIAGNOSIS:  Chest pain status post cardiac catheterization  this admission showing no significant high-grade focal obstruction with  low-normal ejection fraction with recommendations for more aggressive  blood pressure control.   PAST MEDICAL HISTORY:  1. Hypertension, diagnosed in February 2009, poorly controlled.  2. History of rhabdomyolysis in the setting of TriCor usage.  3. Posttraumatic stress disorder.  4. Depression/mood disorder.  5. History of sinusitis status post surgery in 2001.  6. Hemorrhoids.  7. Hyperlipidemia with intolerance to multiple STATINS and FIBRATES.  8. Peptic ulcer disease.  9. Positive Helicobacter pylori in October 2009.  The patient has      completed a course of antibiotics, currently on a proton pump      inhibitor.  10.Status post echocardiogram in February 2009 with normal left      ventricular function.   HOSPITAL COURSE:  [name] is a 45 year old Caucasian gentleman  followed by Dr. Eligah East at [facility].  [name] has a history of hypertension  and hyperlipidemia with intolerance to STATIN.  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  The patient was evaluated by the ER staff, found to be hypertensive  with  a blood pressure 155/107.  He was given nitroglycerin by the ER staff,  which decreased his chest pain from a 5 to 2 out of 10.  EKG showed no  acute changes.  Cardiac markers negative x1.  The patient was admitted  for observation.  Cycle cardiac enzymes, which were negative.  The  patient continued to complain of chest discomfort.  The patient  scheduled for cardiac catheterization.  The patient was taken to the  Cath Lab on February 05, 2008, by Dr. Riley Kill, results as stated above.  The patient tolerated the procedure without complications.  Dr.  Gala Romney in to see the patient on day of discharge.  No complaints of  chest discomfort.  Hematocrit 11.9, potassium 4.3, BUN 0.83, total  cholesterol 302, triglycerides 137, HDL 61, LDL 214.  The patient being  discharged home with instructions to follow up with Dr. Eligah East at [facility].   MEDICATIONS AT THE TIME OF DISCHARGE:  1. Nexium 40 daily.  2. Zoloft 50.  3. Elavil 75.  4. Niacin 1500.  5. Alavert, Ambien, and Percocet as previously prescribed.  6. Labetalol has been  increased to 200 mg b.i.d. prescription      provided.  7. Aspirin 325 mg daily.   The patient has been given the post-cardiac catheterization discharge  instructions.  He will schedule an appointment at [facility] to follow up with  Dr. Eligah East, follow up with Dr. Andi Devon, his primary care  physician.   DURATION OF DISCHARGE ENCOUNTER:  Greater than 30 minutes.      Dorian Pod, ACNP      Bevelyn Buckles. Bensimhon, MD  Electronically Signed    MB/MEDQ  D:  02/06/2008  T:  02/07/2008  Job:  [id number]

## 2010-07-21 NOTE — Assessment & Plan Note (Signed)
[facility]                         GASTROENTEROLOGY OFFICE NOTE   NAME:GRANTRayman, Petrosian                      MRN:          [id number]  DATE:03/14/2007                            DOB:          21-Feb-1966    Self-referred.   PRIMARY CARE PHYSICIAN:  Merlene Laughter. Renae Gloss, MD   CHIEF COMPLAINT:  Abdominal discomfort, constipation, intermittent  rectal bleeding.   HISTORY OF PRESENT ILLNESS:  Mr. 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  He says this began  around the time that he was taking a lot of NSAIDs and intermittent  steroids for back pains.  He has not tried antacid medicines for it.  The pains are in his right and left mid quadrants.  Two to three months  ago he was tested for H. pylori, found to be positive and he completed a  Prevpac for it.  He has never had endoscopy.  He has no overt dysphagia.  He also has rather chronic, intermittent constipation.  He has a long  history of anal fissure, last seen by Dr.  Wenda Low in 2002 when he  underwent a repeat anal sphincterotomy.  He does have rare rectal  bleeding.  This is usually around the times of having to strain to move  his bowels.  It sounds like minor bleeding, usually some simple blood on  the tissue paper.   REVIEW OF SYSTEMS:  Notable for a 15-20 pound weight gain in the past  year.  Review of systems is otherwise essentially normal and is  available on his nursing intake sheet.   PAST MEDICAL HISTORY:  Elevated cholesterol, depression, history of  kidney stones.  History of anal fissure status post sphincterotomy in  the past.   CURRENT MEDICATIONS:  Sertraline, Tricor, amitriptyline.   ALLERGIES:  BENADRYL AND LATEX.   SOCIAL HISTORY:  Single, lives alone.  Works as an Museum/gallery exhibitions officer at [employer].  Drinks alcohol intermittently.  Nonsmoker.  Does chew  tobacco  periodically.   FAMILY HISTORY:  No colon cancer, colon polyps in the family.   PHYSICAL EXAMINATION:  5 feet 11 inches, 194 pounds.  Blood pressure  122/80.  Pulse 100.  CONSTITUTIONAL;  Generally well appearing.  NEUROLOGIC:  Alert and oriented x3.  EYES:  Extraocular movements intact.  Mouth, oropharynx moist.  No  lesions.  NECK:  Supple, no lymphadenopathy.  CARDIOVASCULAR:  Heart, regular rate and rhythm.  LUNGS:  Clear to auscultation bilaterally.  ABDOMEN:  Soft, nontender, nondistended.  Normal bowel sounds.  EXTREMITIES:  No lower extremity edema.  SKIN:  No rashes or lesions on visible extremities.   ASSESSMENT AND PLAN:  A 45 year old man with pyrosis, nausea,  constipation, rectal bleeding.   First for his lower gastrointestinal symptoms he does have chronic  intermittent constipation.  I recommend that he take a prune every day  rather than 2-3 prunes as a rescue for constipation.  He does not appear  to be anemic clinically from the minor rectal  bleeding, but I will get a  CBC as well as a basic set of other labs including the thyroid testing,  and complete metabolic profile.  He will be arranged to have a  colonoscopy performed at his soonest convenience.   His upper gastrointestinal symptoms do seem temporarily related to his  nonsteroidal anti-inflammatory drugs usage.  I am giving him samples of  PPI today in the office, and I will call him in a prescription for  Nexium, which he will take 1 pill, 20-30 minutes prior to his breakfast  meal.  Will also arrange for him to have an EGD performed at his soonest  convenience.     Rachael Fee, MD  Electronically Signed    DPJ/MedQ  DD: 03/14/2007  DT: 03/14/2007  Job #: [id number]

## 2010-07-21 NOTE — H&P (Signed)
[name], [name]               ACCOUNT NO.:  [id number]   MEDICAL RECORD NO.:  [id number]          PATIENT TYPE:  INP   LOCATION:  2921                         FACILITY:  [facility]   PHYSICIAN:  Jonelle Sidle, MD DATE OF BIRTH:  1965-11-19   DATE OF ADMISSION:  04/23/2007  DATE OF DISCHARGE:                              HISTORY & PHYSICAL   PRIMARY CARE PHYSICIAN:  Dr. Andi Devon.   CHIEF COMPLAINT:  Dizziness.   HISTORY OF PRESENT ILLNESS:  Chase. [name] is a 45 year old man with  hyperlipidemia who had to leave work at [employer] ED yesterday,  04/22/2007, secondary to dizziness, which he described as the room  spinning coupled with nausea.  He went to bed and woke up later to use  the restroom at which time, his legs gave out on him, and he fell on his  hip.  He did feel the same dizziness at that time but did not lose  consciousness.  He denies chest pain, dyspnea, diaphoresis, and nausea.  His symptoms resolved, and he was fine upon awaking on the morning of  admission. He came to work and started working in [employer], where  he started feeling dizzy and dyspneic.  He put himself on a telemetry  monitor, and found that his heart rate was elevated up to the 160s.  He  also thought he noticed ST changes in V3 and V4.  He denied chest pain,  palpitations, nausea, diaphoresis, and sense of impending doom.  Incidentally, last week as he was repairing a monitor, he noted that his  heart rate was in the 130s.  The most physical activity he does is  housework, given that he has been sedentary for the past year secondary  to chronic back pain.  He noticed that he had dyspnea when going up a  set of stairs last week but has otherwise not experienced dyspnea on  exertion.  He denied orthopnea, PND, as well as claudication.   ALLERGIES:  BENADRYL AND LATEX.   PAST MEDICAL HISTORY:  1. Hyperlipidemia with a history of elevated LFTs secondary to Lipitor      and Crestor.  2. Major depressive disorder.  3. Chronic intermittent constipation.      a.     Seen by Dr. Christella Hartigan in January 2009.  4. Status post H. pylori treatment in 2008 (Pred-Pak).  5. Nasal septal deviation, bilateral chronic pansinuitis, and      bilateral hypertrophic inferior turbinates status post septoplasty,      ethmoidectomy, and inferior turbinate resection by Dr. Lazarus Salines in      July 2001.  6. 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 Daphine Deutscher in February 2004.  7. Mid paraspinal back cyst status post I and D October 2004, by Dr.      Sherald Hess.  8. Bronchitis requiring hospitalization in January 2004.  9. History of nephrolithiasis.   MEDICATIONS:  1. Amitriptyline 75 mg p.o. at bedtime.  2. Citrulline 50 mg p.o. daily.  3. Allegra 180 mg p.o. b.i.d.  4.  Ambien 10 mg p.o. at bedtime p.r.n.  5. Tri-Chlor since December 2008.  6. Protonix since January 2009.   SOCIAL HISTORY:  Patient is single and lives in [place].  He has no  children.  He has been chewing tobacco for the past 40 years.  He rarely  drinks alcohol  (2 or 3 times a year.)  Does not use drugs or herbal medications.  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   REVIEW OF SYSTEMS:  No fever or chills.  Chronic nasal discharge with  associated cough.  No urinary symptoms.  No abdominal pain, current  constipation, or diarrhea.  No melena or rectal bleeding.   PHYSICAL EXAMINATION:  VITAL SIGNS:  Temperature 97.9.  Respiratory rate  18.  Heart rate 115.  Blood pressure 158/97.  O2 sats 96% on room air.  GENERAL:  No acute distress.  HEENT:  PERRLA.  EOMI.  Anicteric sclerae.  No nasal discharge.  Moist  mucous membranes.  Fair dentition.  Oropharynx erythematous with  surgical changes.  NECK:  Supple.  No lymphadenopathy, thyromegaly, carotid bruit, JVD at 6  cm.  CARDIOVASCULAR:  Tachycardic, but regular.  Normal S1 and S2.  No S3 or  S4, murmurs,  rubs, or gallops.  Normal PMI.  Pulses 2+ symmetrically.  LUNGS:  Clear to auscultation bilaterally without wheezing, rales, or  rhonchi.  Good air movement.  ABDOMEN:  Bowel sounds positive.  Soft, nontender, nondistended.  No  palpable masses or organomegaly.  EXTREMITIES:  No clubbing, cyanosis, or edema.  MUSCULOSKELETAL:  Two surgical I and D scars on back.  NEUROLOGIC:  Awake, alert, and oriented x3.  Cranial nerves II through  XII grossly intact.  Strength 5/5 in all extremities.  Sensitivity to  light touch normal and symmetrical.  Normal cerebellar function.   RADIOLOGIC DATA:  Chest x-ray 04/23/2007, shows segmental atelectasis of  left lower base.  No acute disease.   Chest CT angio 04/23/2007, no PE or defection.  Mild bibasilar  atelectasis or scar.   EKG sinus tachycardia at a rate of 128.  No ST changes or T-wave  inversions.  No hypertrophy.  QRS 104.  Corrected QT 449.  PR 150.  Q  waves in 3 and AVS.   LABORATORY DATA:  White blood count 8.0, hemoglobin 13.7, platelets 334.  ANC 5.2.  MCV 83.  RDW 14.3.  Sodium 137, potassium 3.3, chloride 102,  bicarb 25, BUN 7, creatinine 1.0, glucose 125, calcium 9.3.  Total  bilirubin 0.9, alk phos 52, AST 531, ALT 198.  Total protein 7.1,  albumin 3.9.  D-dimer 1.49.  PTT 27, PT 13.4, INR 1.0.  Urinalysis  protein 1+, 0-2 red blood cells, granular cast.  Urine drug screen  negative.  Urine tricyclics positive.   Point-of-care markers at 1155.  Troponin negative.  CK MB 77.  Point-of-  care markers at 1300.  Troponin negative.  CK MB 62.   ASSESSMENT/PLAN:  1. Presyncope.  The differential diagnosis for presyncope is wide, and      we will admit Chase. [name] to rule out serious or life-threatening      etiology.  A pulmonary embolism has been ruled out in light of a      normal chest CT angiography.  His pretest probability of pulmonary      embolism was low to begin with.  We will rule out acute coronary      syndrome by  cycling  his cardiac enzymes and by following serial      EKGs.  We will go ahead and start him on therapeutic dose Lovenox,      aspirin, beta blocker, and ace inhibitor, as tolerated.  We will      obtain a 2-D echo to evaluate for intracardiac thrombus and any      evidence of heart failure.  Given that the presyncope could also      have been caused by TIA, carotid Doppler will also be done.  Of      note, we will have to stop his statin given his elevated liver      enzymes.  Orthostatics will be documented, and thyroid function      test will also be checked to see if they could explain his sinus      tachycardia.  2. Isolated transaminitis.  Given that Chase. Genova has a history of      transaminitis secondary to both Lipitor and Crestor, it is possible      that the current elevated liver function tests are secondary to the      Tri-Chlor that he started in December.  Curiously, his liver      function tests were normal on 03/13/2007 (AST 24, ALT 27), at which      point, he had already been on Tri-Chlor for 6 weeks.  We will go      ahead and check an acute hepatitis panel to rule out a viral      hepatitis.  We will also obtain a right upper quadrant ultrasound      to evaluate for evidence of infiltration. Obstructive hepatic      illness is very unlikely given that his alk phos and bilirubin are      within normal limits.  As previously mentioned, we will discontinue      the Tri-Chlor and not start another statin at this time.  The      transaminitis could be secondary to his elevated MB in the event of      an acute coronary syndrome.  3. Hyperlipidemia.  As previously mentioned, we will need to      discontinue Chase. Jasier Calabretta given his elevated liver enzymes.      We will monitor the fasting lipid profile tomorrow morning.  4. Hypertension.  Patient is not treated for elevated blood pressure      on an outpatient basis.  It is possible that his hypertension is       situational.  We will monitor his blood pressure and start him on a      low dose of metoprolol and ace inhibitor given the possibility of      an acute coronary syndrome.  5. Major depressive disorder.  Patient is not suicidal or homicidal.      We will continue his amitriptyline and citrulline at home doses.      We will also write for p.r.n. Ambien for insomnia.  6. Gastroesophageal reflux disease.  Status post H. pylori treatment.      We will continue the Protonix that Dr. Christella Hartigan started him on in      January 2009.  7. Further testing and workup will depend on abovementioned tests and      investigations.  If his enzymes trend to a clearer picture for      acute coronary syndrome, we will send patient for a cardiac cath.      Rita Ohara  Reynold Bowen, M.D.  Electronically Signed      Jonelle Sidle, MD  Electronically Signed    MC/MEDQ  D:  04/23/2007  T:  04/24/2007  Job:  [id number]

## 2010-07-21 NOTE — H&P (Signed)
NAME:  [name], [name] NO.:  [id number]   MEDICAL RECORD NO.:  [id number]          PATIENT TYPE:  EMS   LOCATION:  MAJO                         FACILITY:  [facility]   PHYSICIAN:  Nicolasa Ducking, ANP DATE OF BIRTH:  04-29-1965   DATE OF ADMISSION:  02/03/2008  DATE OF DISCHARGE:                              HISTORY & PHYSICAL   PRIMARY CARDIOLOGIST:  Jonelle Sidle, MD   PRIMARY CARE Vasilis Luhman:  Lupita Raider, MD   PATIENT PROFILE:  A 45 year old Caucasian male without prior history of  CAD who presents with chest pain.   PROBLEMS:  1. Chest pain.  2. Hypertension diagnosed in February 2009, poorly controlled.  3. History of rhabdomyolysis in the setting of TriCor usage in      February 2009.  4. Posttraumatic stress disorder.  5. History of sinusitis, status post surgery in 2001.  6. Hemorrhoids.  7. Hyperlipidemia, intolerant to multiple statins and fibrates.  8. Depression/mood disorder.  9. Peptic ulcer disease, taking 325 aspirin b.i.d.  10.Positive Helicobacter pylori in October 2009, has completed a      course of antibiotics and is currently on PPI.  11.Normal left ventricular function, EF 55% to 65% by echocardiogram      of April 24, 2007.   HISTORY OF PRESENT ILLNESS:  A 45 year old Caucasian male without prior  cardiac history.  He does have a history of hypertension and poorly  controlled hyperlipidemia.  Today, he was in his usual state of health  until approximately 12 p.m. 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  He was placed on O2 by the ER staff and began to feel  immediately better, although not completely better.  His blood pressure  and heart rate were noted to be elevated at 155/107 and 120  respectively.  He noted that at the onset of symptoms, it was time for  him to  take his noon dose of labetalol and he went ahead and took that.  His discomfort was 2/10 with improvement in blood pressure and heart  rate, but then did briefly go up to 3/10 for which he was given  nitroglycerin by the ER staff causing headache and reducing his  discomfort back to about 2/10.  ECG showed no acute changes and cardiac  markers are negative x1.  We have been asked to evaluate the patient.  He does continue to have ongoing minimal chest discomfort that appears  to feel well.   ALLERGIES:  Cora Collum, which caused rhabdomyolysis in November  2009, BENADRYL, and LASIX.   HOME MEDICATION:  1. Zoloft 50 mg daily.  2. Percocet 5/325 mg p.r.n.  3. Amitriptyline 75 mg nightly.  4. Nexium 40 mg daily.  5. Labetalol 100 mg b.i.d.  6. Niacin 1500 mg b.i.d.  7. Alavert p.r.n.  8. Aspirin 325 mg b.i.d., which has been on hold since mid October      2009 secondary to ulcer.  FAMILY HISTORY:  Mother died at age 70 of sepsis and Alzheimer's.  Father is alive at age 63 with the history of hyperlipidemia and back  problems.  He has a brother who is age 73 who has had problems with  hematuria.   SOCIAL HISTORY:  Lives in [place] by himself.  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  He never smoked cigarettes and  denies alcohol or drug use.  He does chew two packs of tobacco a week.   REVIEW OF SYSTEMS:  Positive for chest discomfort as outlined above.  Positive for GERD-like symptoms in the setting of peptic ulcer disease  as well as some diarrhea over the past couple of weeks.  Otherwise, all  systems reviewed and negative.  He is a full code.   PHYSICAL EXAMINATION:  VITAL SIGNS:  Temperature 90.1, heart rate was  120, now 92, respirations 20; blood pressure was 155/107, now 158/79,  pulse ox 100% on 2 liters.  GENERAL:  Pleasant white male in no acute distress.  Awake, alert, and  oriented x3.  HEENT:  Normal nares grossly intact, nonfocal.   SKIN:  Warm and dry without lesions or masses.  MUSCULOSKELETAL:  Grossly normal without deformity or effusions.  Full  range of motion in bilateral upper and lower extremities.  NECK:  No bruits, JVD, or lymphadenopathy.  LUNGS:  Respirations are regular and unlabored.  CARDIAC:  Regular S1 and S2.  No S3, S4, or murmurs.  ABDOMEN:  Round, soft, nontender, and nondistended.  Bowel sounds  present x4.  EXTREMITIES:  Warm, dry, and pink.  No clubbing, cyanosis, or edema.  Dorsalis pedis, posterior tibial pulses 2+ and equal bilaterally.   Chest x-ray shows no acute disease.  EKG shows sinus tachycardia, rate  of 102, normal axis, no acute ST-T changes.  Hemoglobin 12.1, hematocrit  36.0, WBC 4.3, and platelets 241.  Sodium of 40, potassium 2.7, chloride  104, CO2 28, BUN 9, creatinine 0.9, glucose 109, CK-MB less than 1.0,  troponin-I less than 0.05.   ASSESSMENT AND PLAN:  1. Chest pain.  This occurred at rest and is now persisted for about 2-      1/2 hours.  Despite the prolonged duration, source of the cardiac      markers are negative and ECG is without acute changes.  He has had      some improvement with reduction of blood pressure and heart rate.      He was taking labetalol.  Plan to observe tonight and cycle cardiac      markers.  We will titrate his beta-blocker to 200 mg b.i.d.      Provided that enzymes remain negative, we will plan an outpatient      Myoview early next week in the office.  If his enzymes turn      positive, we would plan on cardiac catheterization.  With his      recent history of peptic ulcer disease/Helicobacter pylori, we will      hold off on anticoagulation for now.  We will reinitiate aspirin.  2. Hypertension, poorly controlled, titrate labetalol.  3. Hyperlipidemia.  He is intolerant to statins and has had      rhabdomyolysis with Tricor.  Continue niacin.  4. Peptic ulcer disease.  Continue PPI.  He has completed a course of      antibiotics.   5. Depression.  Continue home medications.  Nicolasa Ducking, ANP    CB/MEDQ  D:  02/03/2008  T:  02/03/2008  Job:  [id number]

## 2010-07-24 NOTE — Op Note (Signed)
   NAME:  [name], [name]                         ACCOUNT NO.:  [id number]   MEDICAL RECORD NO.:  [id number]                   PATIENT TYPE:  AMB   LOCATION:  DSC                                  FACILITY:  [facility]   PHYSICIAN:  Thornton Park. Daphine Deutscher, M.D.             DATE OF BIRTH:  05/29/1965   DATE OF PROCEDURE:  05/01/2002  DATE OF DISCHARGE:                                 OPERATIVE REPORT   PREOPERATIVE DIAGNOSIS:  Posterior fissure.   POSTOPERATIVE DIAGNOSIS:  Posterior fissure.   PROCEDURE:  Left lateral internal sphincterotomy.   ANESTHESIA:  General by LMA.   DESCRIPTION OF PROCEDURE:  The patient is a 45 year old gentleman who has  had recurrent bouts of perianal pain and on exam was found to have a very  tight anal sphincter and a torn posterior fissure down into the muscle.  Informed consent was obtained regarding a lateral internal sphincterotomy  with the potential risks and complications not limited to incontinence,  infection.   With him in the dorsal lithotomy position under general by LMA, the area was  prepped with Betadine and draped sterilely.  Again after inserting the  Ferguson retractor, the left lateral internal sphincter was quite tight and  was easily palpable.  I infiltrated the area with a little bit of lidocaine  with epinephrine and then with a 15 blade went in and nicked about 1.5 cm of  muscle and with my finger massaged the area to feel that give away, relaxing  that segment of the internal sphincter.  Minimal bleeding was elicited.  The  area was then compressed and was not bleeding.  A little sterile dressing  was applied, and the patient was taken to the recovery room in satisfactory  condition.  He will be kept in overnight observation since he lives in  [facility] and has no other way home.                                               Thornton Park Daphine Deutscher, M.D.    MBM/MEDQ  D:  05/01/2002  T:  05/01/2002  Job:  [id number]

## 2010-07-24 NOTE — H&P (Signed)
   NAME:  [name], [name]                         ACCOUNT NO.:  [id number]   MEDICAL RECORD NO.:  [id number]                   PATIENT TYPE:  INP   LOCATION:  5702                                 FACILITY:  [facility]   PHYSICIAN:  Merlene Laughter. Renae Gloss, M.D.           DATE OF BIRTH:  [birth year]   DATE OF ADMISSION:  03/14/2002  DATE OF DISCHARGE:                                HISTORY & PHYSICAL   CHIEF COMPLAINT:  Shortness of breath, fever, cough.   HISTORY OF PRESENT ILLNESS:  The patient is a 45 year old gentleman who  presents with a two-week history of progressively worsening shortness of  breath and productive cough.  He has been on two antibiotic regimens  including Augmentin and penicillin as an outpatient; however, he has  apparently failed treatment.  He is extremely dyspneic on exertion and also  complains of chest pain with cough.  He report fever and chills but no  nausea or vomiting.  He has no other acute constitutional or systemic  complaints at this time.   ALLERGIES:  BENADRYL.   MEDICATIONS:  1. Lipitor 40 mg p.o. daily.  2. Allegra 180 mg p.o. daily.   PAST MEDICAL HISTORY:  1. Hyperlipidemia.  2. History of severe sinusitis.   FAMILY HISTORY:  Noncontributory.   SOCIAL HISTORY:  The patient lives alone and is employed at [employer] in alcohol and drugs of abuse.   REVIEW OF SYSTEMS:  As per HPI and the patient's health history assessment.   PHYSICAL EXAMINATION:  GENERAL:  Well developed, well nourished, ill-  appearing white male.  VITAL SIGNS:  Temperature 98.7, heart rate 77, respirations 20, blood  pressure 151/95.  HEENT:  TMs normal bilaterally.  No oropharyngeal lesions.  NECK:  Supple.  No masses, 2+ carotids, no bruits.  LUNGS:  Decreased breath sounds in bases bilaterally.  HEART:  S1, S2.  Regular rate and rhythm.  No murmurs, rubs, or gallops.  ABDOMEN:  Soft, nontender, nondistended.  Positive bowel sounds.  EXTREMITIES:  No  clubbing, cyanosis, or edema.  NEUROLOGIC:  Alert and oriented x 3.  Cranial nerves intact.    ASSESSMENT/PLAN:  Shortness of breath, fever, and cough.  As the patient has  failed outpatient antibiotic treatment, he will require hospitalization for  treatment of pneumonia or asthma and bronchitis with intravenous antibiotics  and nebulizers and intravenous steroids.                                                 Merlene Laughter. Renae Gloss, M.D.    KRS/MEDQ  D:  03/16/2002  T:  03/16/2002  Job:  [id number]

## 2010-07-24 NOTE — Discharge Summary (Signed)
   NAME:  [name], [name]                         ACCOUNT NO.:  [id number]   MEDICAL RECORD NO.:  [id number]                   PATIENT TYPE:  INP   LOCATION:  5702                                 FACILITY:  [facility]   PHYSICIAN:  Merlene Laughter. Renae Gloss, M.D.           DATE OF BIRTH:  1965-11-15   DATE OF ADMISSION:  03/14/2002  DATE OF DISCHARGE:  03/16/2002                                 DISCHARGE SUMMARY   DISCHARGE DIAGNOSES:  1. Asthmatic bronchitis.  2. Hypokalemia, resolved.  3. Hyperlipidemia.   HOSPITAL COURSE:  The patient was admitted for severe dyspnea on  exertion/productive cough.  He had failed two courses of antibiotics as an  outpatient.  While hospitalized, however, he improved significantly with IV  steroids, albuterol nebulizer treatments and IV antibiotics.  At the time of  discharge he has nearly returned to his baseline respiratory status.  His  chest x-ray was clear and showed no active disease.  He will continue  outpatient treatment for asthmatic bronchitis.   The patient's potassium at discharge is 3.9.  He will continue potassium  supplementation while on albuterol.   DISCHARGE MEDICATIONS:  1. Z-Pak.  2. Lipitor 40 mg p.o. once daily.  3. Albuterol nebulizer q.4h.  4. Prednisone 6-day taper.  5. Tussionex 1 teaspoon q.12h. p.r.n.  6. Potassium 20 mEq one p.o. once daily while on albuterol nebs.   FOLLOW UP:  The patient will be seen by Dr. Renae Gloss in 2 weeks following  discharge.                                               Merlene Laughter. Renae Gloss, M.D.    KRS/MEDQ  D:  03/16/2002  T:  03/17/2002  Job:  [id number]

## 2010-07-24 NOTE — Op Note (Signed)
[place]  Patient:    [name], [name]                      MRN: [id number] Proc. Date: 09/28/99 Adm. Date:  [id number] Attending:  Fernande Boyden CC:         Ammie Dalton, M.D.             Gloris Manchester. Lazarus Salines, M.D.                           Operative Report  PREOPERATIVE DIAGNOSIS: 1. Nasal septal deviation. 2. Chronic bilateral pansinusitis. 3. Hypertrophic inferior turbinates bilateral.  POSTOPERATIVE DIAGNOSIS: 1. Nasal septal deviation. 2. Chronic bilateral pansinusitis. 3. Hypertrophic inferior turbinates bilateral.  OPERATION PERFORMED: 1. Nasal septoplasty. 2. Bilateral anterior endoscopic ethmoidectomy. 3. Bilateral endoscopic antrostomy. 4. Bilateral submucous resection of inferior turbinates.  SURGEON:  Gloris Manchester. Lazarus Salines, M.D.  ASSISTANT:  ANESTHESIA:  General orotracheal.  ESTIMATED BLOOD LOSS:  Minimal.  COMPLICATIONS:  None.  FINDINGS:  An overall leftward septal deviation including prominent maxillary crest spurring into the left floor of the nose.  Hypertrophic inferior turbinates, left greater than right.  Concha bullosa of the anterior pole of the right middle turbinate.  Mild chronic polypoid thickening of the mucosa of the ethmoid and maxillary sinuses bilateral.  Irregular anatomy of the osteomeatal complex including insertion of the middle turbinate on the lamina papyracea and paradoxical curvature of the inferior edge of the turbinates on both sides with a poorly developed uncinate process.  DESCRIPTION OF PROCEDURE:  With the patient in a comfortable supine position, general orotracheal anesthesia was induced without difficulty.  At an appropriate level, the patient was placed in a slight sitting position.  A saline moistened throat pack was placed.  Nasal vibrissae were trimmed. Cocaine crystals, 200 mg total were applied on cotton carriers to the anterior ethmoid and sphenopalatine ganglion  regions on both sides.  Cocaine solution, 160 mg total was applied on 1/2 x 3 inch cottonoids to both sides of the septum.  1% Xylocaine with 1:100,000 epinephrine 8 cc total was infiltrated into the membranous columella, into the anterior floor of the nose, into the nasal spine region, and into the submucoperichondrial plane of the septum on both sides.  Approximately 10 minutes were allowed for hemostasis and anesthesia to take effect.  A sterile preparation and draping of the midface was accomplished.  The materials were removed from the nose and observed to be intact and correct in number.  The findings were as described above.  A left-sided hemitransfixion incision was begun and carried down to the caudal edge of the quadrangular cartilage and continued into a floor tunnel.  The right side of the floor tunnel was elevated.  On further inspection, it was felt that a dissection on the right side was going to be easier and the incision from the floor was carried up to a hemitransfixion incision and the submucoperichondrial plane of the right side of the septum was entered.  Floor tunnels were elevated on both sides.  The right septal tunnel and floor tunnel were communicated with the flap intact, carried back onto the perpendicular plate of the ethmoid and brought down to the floor of the nose.  The chondroethmoid junction was identified and lysed from the opposite submucoperiosteal plane and the septum was elevated.  Using open Jansen-Middleton forceps, the superior perpendicular plate of the ethmoid was lysed and  the inferior portion was then rocked free and delivered.  Bony spicules along the vomer and approaching the rostrum of the sphenoid were removed where they were deviated.  The septum was dislocated from the maxillary crest and communicated with the left floor tunnel.  The inferior edge of the quadrangular cartilage was submucosally resected where it was elongated into the  floor of the nose.  This allowed the septum to trapdoor into the midline with good support and a straight septum.  A pocket was made between the medial crurae of the lower lateral cartilages and the septum was tucked into this pocket with an external 4-0 chromic suture.  At this point the septum was straight with good support and it was secured to the nasal spine with a figure-of-eight 4-0 PDS suture.  Hemostasis was observed.  Small remnants of bone were carefully removed.  The septal tunnels were laid back down and the incisions were closed.  This completed the septoplasty.  At this point the nose was inspected once again.  Additional 1% Xylocaine with 1:100,000 epinephrine, approximately 6 cc total was infiltrated on a 25 gauge spinal needle into the middle turbinate on each side into the lateral wall of the nose in the region of the uncinate proscess.  Additional 4% cocaine solution, 160 mg total was applied on 1/2 x 3 inch cottonoids into the middle meatus and between the middle turbinate and the septum on both sides. Additional minutes were allowed for hemostasis and anesthesia to take effect. The materials were removed from the right side of the nose and observed to be intact and correct in number.  The CT scans had been reviewed in the hiatus. The anterior edge of the middle turbinate was incised in a sagittal direction and the concha bullosa cell entered and the lateral wall taken off with punch forceps and the power debrider.  At this point palpation with a sickle knife revealed no well-developed uncinate process but did show a large bulla ethmoidalis.  The face was opened with a punch forceps and the bullar walls were cleared with a punch forceps and also with the power debrider.  This was taken back to the vertical lamella of the middle turbinate at which point the dissection was stopped.  Upon reviewing the CT scan and noting the unusual anatomy of the inferior turbinate and  osteomeatal complex, a curved olive tip suction was gently used to palpate the anterior middle meatus region and the  maxillary sinus was entered.  The opening was enlarged anteriorly and posterior using forward and back biting punch forceps and the power debrider. Bony partitions were taken down.  Using the 45 degree up punch and the 30 degree endoscopes, ager nasi cells were debrided but the frontal recess was not fully explored.  Further exploration at this point of the anterolateral wall of the nose revealed some soft area which was carefully elevated with a sickle knife thinking that this was the remnant of the uncinate process. However, periorbita was identified by palpation of the globe probably in the region of the lacrimal sac and no further dissection at this point was attempted.  No orbital fat was noted. At this point bony partitions were minimally debrided to complete the clearance of the cavity.  The middle turbinate had good support.  There was no evidence of CSF leak or orbital violation.  The right ethmoidectomy and antrostomy was completed.  A cocaine moistened pledget was placed in the middle meatus.  Attention was turned to  the left side of the nose where the materials were removed and observed to be intact and correct in number.  The findings were as described above.  On this side the sickle knife was used to incise the lateral wall of the nose and the presumed region of the uncinate process which was not, however, clearly defined.  The bulla cell was clearly identified and was opened with the punch forceps and then removed with power debrider.  On working inferolateral, what appeared to be additional mucosa was felt to periorbita with a small amount of periorbital fat exposed and this area was asiduously avoided for the remainder of the case.  This seemed to be slightly posterior to the lacrimal sac region.  Careful debridement to remove the anterior ethmoid cells  and the dissection was stopped at the vertical lamella of the middle turbinates.  Ager nasi cells were also removed but the frontal recess was not explored.  Once again, taking care to protect the region of the exposed orbital fat, the olive tip suction was used to palpate in the middle meatus and the maxillary sinus was identified.  The opening was enlarged anteriorly and posteriorly and communicated with the ethmoid block appropriately.  At this point there was no evidence of additional violation ofthe orbit and no evidence of CSF leak.  It was not apparent that any orbital content had actually been removed.  No significant bleeding was noted.  The cocaine moistened pledget was placed in the middle meatus.  Attention was returned to the right side where the cocaine pledget was removed and again adequacy of dissection was observed and no further dissection was required.  The same was performed on the left side.  Additional 1% Xylocaine with 1:100,000 epinephrine 3 cc total was infiltrated into the inferior turbinates on each side.  After allowing several minutes for this to work, the anterior hood of the right inferior turbinate was lysed. The medial mucosa was incised in an anterior upsloping fashion and a laterally based flap was developed.  The turbinate was infractured.  Using angled turbinate scissors, the turbinate bone and lateral mucosa was resected in a downsloping fashion taking virtually all of the anterior pole and leaving virtually all of the posterior pole.  Bony spicules were removed for more prompt healing.  The mucosal edges were electrocoagulated.  The flap was laid back down.  The turbinate was outfractured and this side was completed.  The left side was performed in an identical fashion.  At this point 0.040 reinforced Silastic splints were fashioned, placed in the nose and secured thereto with a 3-0 nylon stitch.  Triple thickness Telfa packs tagged with a 2-0 silk  were impregnated with Neosporin ointment and placed into the middle meatus on each side.  Additional Telfa packs were placed between the middle turbinates and the septum on each side.  At this point the procedure was completed.  The pharynx was suctioned free and the throat pack was removed.  A drip pad was applied.  The patient was returned to anesthesia, awakened, extubated and transferred to recovery in stable condition.  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  The minimal orbital violation on each side will warrant careful observation  although the patient did have intact range of motion and intact vision in each eye in recovery immediately after awakening.  The patient will be observed 23 hours given his social situation with no one at home to care for him adequately with attention to ice, elevation, analgesia, antibiosis, observation for bleeding, signs of infection or any visual compromise. DD:  09/28/99 TD:  09/29/99 Job: [id number]

## 2010-07-24 NOTE — Op Note (Signed)
NAME:  [name], [name]                         ACCOUNT NO.:  [id number]   MEDICAL RECORD NO.:  [id number]                   PATIENT TYPE:  AMB   LOCATION:  DSC                                  FACILITY:  [facility]   PHYSICIAN:  Brantley Persons, M.D.             DATE OF BIRTH:  09/27/1965   DATE OF PROCEDURE:  12/24/2002  DATE OF DISCHARGE:                                 OPERATIVE REPORT   PREOPERATIVE DIAGNOSIS:  Recurrent left mid-paraspinal back soft tissue  mass.   POSTOPERATIVE DIAGNOSIS:  Recurrent left mid-paraspinal back soft tissue  mass.   PROCEDURE:  Incision and drainage of left middle paraspinal back mass with  excision of infected cyst.   ATTENDING SURGEON:  Brantley Persons, M.D.   ANESTHESIA:  1% lidocaine with epinephrine.   COMPLICATIONS:  None.   INDICATION FOR PROCEDURE:  The patient is a 45 year old Caucasian male who  presents with a recurrent back soft tissue mass.  This more than likely is a  sebaceous cyst.  He notes that it has been getting worse and more tender  over the past week.  On exam it appears inflamed and possibly infected.  I  explained to the patient that if I were to proceed with operating at this  point that if I closed his wound, it would more than likely get infected.  I  would prefer to treat him with antibiotics and then come back afterwards.  However, the patient said that it is so very painful for him whenever  anything touches it and he is unable to sleep at night, so he would like to  have it removed at this time.  He understands that he is at much higher risk  for infection in the wound.  He also understands that we may more than  likely leave the wound open and pack it to heal it in.  He asked that we  proceed with the removal of the cyst.   DESCRIPTION OF PROCEDURE:  The patient was brought into the minor OR room  and placed on the table in the prone position.  The soft tissue mass was  identified.  It measured  approximately 3.2 cm in greatest diameter.  The  skin and subcutaneous tissue in the area of the mass were then injected with  1% lidocaine with epinephrine.  The skin was prepped with Betadine and  draped in a sterile fashion.  After adequate hemostasis and anesthesia had  taken effect, the procedure was begun.  Clinically there appeared to be a  skin pore involved with the deeper structures.  A very small skin ellipse  was made over this area to incorporate removal of the involved skin pore.  Upon entering the subcutaneous tissues, it was apparent that this mass was  more than likely a sebaceous cyst.  As the cyst was being removed, part of  the cyst lining was entered.  Upon entering  this part of the cyst, no gross  purulence was present; however, there was a bad odor to the contents of the  cyst and the contents appeared a little yellow rather than the typical  cheesy material that is normally present.  All of the inflamed contents of  the cyst were removed.  Prior to removing the contents, both aerobic and  anaerobic cultures were obtained.  They will be sent to the lab for  pathologic evaluation.  The cyst lining was then sharply excised.  The cyst  did appear to go down slightly below the muscle layer of the back and was  rather deep in an upward direction.  However, it appeared that I had removed  all the cyst lining along with the cyst contents.  The wound was then  irrigated with saline irrigation.  Good hemostasis was present.  The wound  was then packed with iodoform gauze along with 4 x 4's over this.  The  patient will leave  this dressing on overnight and we will see him tomorrow in the office for a  wound check.  The patient was given proper postoperative wound care  instructions and discharged home in stable condition.  Follow-up appointment  will be tomorrow in the office.                                               Brantley Persons, M.D.    MC/MEDQ  D:   12/25/2002  T:  12/25/2002  Job:  [id number]

## 2010-09-10 ENCOUNTER — Encounter: Payer: Self-pay | Admitting: Cardiovascular Disease

## 2010-11-30 LAB — CK TOTAL AND CKMB (NOT AT ARMC)
Relative Index: 0.2
Total CK: 12070 — ABNORMAL HIGH

## 2010-11-30 LAB — BASIC METABOLIC PANEL
BUN: 6
CO2: 23
Chloride: 106
Chloride: 106
Creatinine, Ser: 0.95
GFR calc Af Amer: >60
Glucose, Bld: 98
Potassium: 3.4 — ABNORMAL LOW
Potassium: 3.5

## 2010-11-30 LAB — DIFFERENTIAL
Basophils Relative: 1
Basophils Relative: 2 — ABNORMAL HIGH
Eosinophils Absolute: 0.3
Lymphs Abs: 1.3
Lymphs Abs: 1.8
Monocytes Absolute: 0.4
Monocytes Relative: 6
Monocytes Relative: 7
Neutro Abs: 4.2
Neutro Abs: 5.2
Neutrophils Relative %: 65

## 2010-11-30 LAB — HEPATIC FUNCTION PANEL
ALT: 185 — ABNORMAL HIGH
AST: 258 — ABNORMAL HIGH
Albumin: 4.1
Alkaline Phosphatase: 59
Bilirubin, Direct: <0.1
Bilirubin, Direct: <0.1
Total Bilirubin: 0.4

## 2010-11-30 LAB — CBC
HCT: 33 — ABNORMAL LOW
HCT: 40.9
HCT: 41
Hemoglobin: 11.4 — ABNORMAL LOW
Hemoglobin: 13.7
MCHC: 33.6
MCHC: 33.6
MCHC: 33.7
MCV: 83.6
MCV: 83.6
Platelets: 256
Platelets: 347
RBC: 4.07 — ABNORMAL LOW
RDW: 14.3
WBC: 5.9

## 2010-11-30 LAB — PROTIME-INR
INR: 1
Prothrombin Time: 13.4

## 2010-11-30 LAB — COMPREHENSIVE METABOLIC PANEL
ALT: 161 — ABNORMAL HIGH
ALT: 168 — ABNORMAL HIGH
ALT: 173 — ABNORMAL HIGH
AST: 153 — ABNORMAL HIGH
AST: 411 — ABNORMAL HIGH
Albumin: 3 — ABNORMAL LOW
Albumin: 3.8
Alkaline Phosphatase: 40
Alkaline Phosphatase: 52
BUN: 7
CO2: 25
CO2: 25
CO2: 28
Calcium: 8.5
Calcium: 9.3
Calcium: 9.6
Creatinine, Ser: 0.83
Creatinine, Ser: 0.91
GFR calc Af Amer: >60
GFR calc Af Amer: >60
GFR calc non Af Amer: >60
GFR calc non Af Amer: >60
Glucose, Bld: 125 — ABNORMAL HIGH
Glucose, Bld: 84
Glucose, Bld: 90
Potassium: 3.4 — ABNORMAL LOW
Sodium: 138
Sodium: 140
Sodium: 142
Total Protein: 5.5 — ABNORMAL LOW
Total Protein: 6.9
Total Protein: 7.1

## 2010-11-30 LAB — LIPID PANEL
Cholesterol: 204 — ABNORMAL HIGH
Cholesterol: 302 — ABNORMAL HIGH
HDL: 39 — ABNORMAL LOW
HDL: 47
LDL Cholesterol: 131 — ABNORMAL HIGH
Total CHOL/HDL Ratio: 5.2
Total CHOL/HDL Ratio: 6.4

## 2010-11-30 LAB — D-DIMER, QUANTITATIVE: D-Dimer, Quant: 1.49 — ABNORMAL HIGH

## 2010-11-30 LAB — ALT: ALT: 148 — ABNORMAL HIGH

## 2010-11-30 LAB — RAPID URINE DRUG SCREEN, HOSP PERFORMED
Amphetamines: NOT DETECTED
Benzodiazepines: NOT DETECTED
Cocaine: NOT DETECTED

## 2010-11-30 LAB — URINALYSIS, ROUTINE W REFLEX MICROSCOPIC
Glucose, UA: NEGATIVE
Ketones, ur: NEGATIVE
Protein, ur: 30 — AB
Urobilinogen, UA: 1

## 2010-11-30 LAB — AST: AST: 308 — ABNORMAL HIGH

## 2010-11-30 LAB — TSH: TSH: 2.25

## 2010-11-30 LAB — CARDIAC PANEL(CRET KIN+CKTOT+MB+TROPI)
Relative Index: 0.2
Total CK: 20108 — ABNORMAL HIGH
Troponin I: 0.01

## 2010-11-30 LAB — HEPATITIS C ANTIBODY: HCV Ab: NEGATIVE

## 2010-11-30 LAB — CK: Total CK: 6081 — ABNORMAL HIGH

## 2010-11-30 LAB — HIV ANTIBODY (ROUTINE TESTING W REFLEX): HIV: NONREACTIVE

## 2010-11-30 LAB — URINE MICROSCOPIC-ADD ON

## 2010-11-30 LAB — POCT CARDIAC MARKERS
CKMB, poc: 61.6
Operator id: 285491
Troponin i, poc: <0.05

## 2010-11-30 LAB — MAGNESIUM: Magnesium: 2.2

## 2010-11-30 LAB — TRICYCLICS SCREEN, URINE: TCA Scrn: POSITIVE — AB

## 2010-12-08 LAB — DIFFERENTIAL
Basophils Absolute: 0 10*3/uL (ref 0.0–0.1)
Basophils Relative: 1 % (ref 0–1)
Eosinophils Absolute: 0.2 10*3/uL (ref 0.0–0.7)
Eosinophils Relative: 5 % (ref 0–5)
Monocytes Absolute: 0.3 10*3/uL (ref 0.1–1.0)
Monocytes Relative: 6 % (ref 3–12)
Neutro Abs: 2.7 10*3/uL (ref 1.7–7.7)

## 2010-12-08 LAB — TROPONIN I: Troponin I: <0.01 ng/mL (ref 0.00–0.06)

## 2010-12-08 LAB — CARDIAC PANEL(CRET KIN+CKTOT+MB+TROPI)
CK, MB: 1.2 ng/mL (ref 0.3–4.0)
Total CK: 125 U/L (ref 7–232)
Troponin I: 0.01 ng/mL (ref 0.00–0.06)
Troponin I: <0.01 ng/mL (ref 0.00–0.06)

## 2010-12-08 LAB — CK TOTAL AND CKMB (NOT AT ARMC)
CK, MB: 1.1 ng/mL (ref 0.3–4.0)
Relative Index: 0.8 (ref 0.0–2.5)

## 2010-12-08 LAB — COMPREHENSIVE METABOLIC PANEL
Albumin: 3.9 g/dL (ref 3.5–5.2)
BUN: 9 mg/dL (ref 6–23)
Creatinine, Ser: 0.73 mg/dL (ref 0.4–1.5)
GFR calc Af Amer: >60 mL/min (ref >60)
Total Protein: 6.7 g/dL (ref 6.0–8.3)

## 2010-12-08 LAB — CBC
HCT: 35.7 % — ABNORMAL LOW (ref 39.0–52.0)
HCT: 36 % — ABNORMAL LOW (ref 39.0–52.0)
HCT: 37.1 % — ABNORMAL LOW (ref 39.0–52.0)
Hemoglobin: 11.8 g/dL — ABNORMAL LOW (ref 13.0–17.0)
Hemoglobin: 12.1 g/dL — ABNORMAL LOW (ref 13.0–17.0)
Hemoglobin: 12.1 g/dL — ABNORMAL LOW (ref 13.0–17.0)
MCHC: 32.7 g/dL (ref 30.0–36.0)
MCHC: 33.5 g/dL (ref 30.0–36.0)
MCHC: 33.7 g/dL (ref 30.0–36.0)
MCV: 81.9 fL (ref 78.0–100.0)
MCV: 82.8 fL (ref 78.0–100.0)
Platelets: 246 10*3/uL (ref 150–400)
RBC: 4.31 MIL/uL (ref 4.22–5.81)
RBC: 4.4 MIL/uL (ref 4.22–5.81)
RBC: 4.48 MIL/uL (ref 4.22–5.81)
RDW: 14.1 % (ref 11.5–15.5)
RDW: 14.3 % (ref 11.5–15.5)
WBC: 11.2 10*3/uL — ABNORMAL HIGH (ref 4.0–10.5)
WBC: 4.9 10*3/uL (ref 4.0–10.5)

## 2010-12-08 LAB — PROTIME-INR: INR: 1 (ref 0.00–1.49)

## 2010-12-08 LAB — POCT I-STAT, CHEM 8
BUN: 9 mg/dL (ref 6–23)
Chloride: 104 mEq/L (ref 96–112)
Glucose, Bld: 109 mg/dL — ABNORMAL HIGH (ref 70–99)
HCT: 37 % — ABNORMAL LOW (ref 39.0–52.0)
Potassium: 3.7 mEq/L (ref 3.5–5.1)

## 2010-12-08 LAB — POCT CARDIAC MARKERS
CKMB, poc: <1 ng/mL — ABNORMAL LOW (ref 1.0–8.0)
CKMB, poc: <1 ng/mL — ABNORMAL LOW (ref 1.0–8.0)
Troponin i, poc: <0.05 ng/mL (ref 0.00–0.09)

## 2010-12-08 LAB — TSH: TSH: 0.928 u[IU]/mL (ref 0.350–4.500)

## 2010-12-08 LAB — LIPID PANEL
Cholesterol: 302 mg/dL — ABNORMAL HIGH (ref 0–200)
Total CHOL/HDL Ratio: 5 RATIO
VLDL: 27 mg/dL (ref 0–40)

## 2010-12-08 LAB — APTT: aPTT: 26 seconds (ref 24–37)

## 2010-12-11 LAB — BASIC METABOLIC PANEL
BUN: 7 mg/dL (ref 6–23)
CO2: 28 mEq/L (ref 19–32)
Chloride: 106 mEq/L (ref 96–112)
Creatinine, Ser: 0.83 mg/dL (ref 0.4–1.5)

## 2010-12-11 LAB — CBC
MCHC: 34.1 g/dL (ref 30.0–36.0)
MCV: 81.7 fL (ref 78.0–100.0)
Platelets: 232 10*3/uL (ref 150–400)
RDW: 13.9 % (ref 11.5–15.5)

## 2010-12-21 ENCOUNTER — Encounter: Payer: Self-pay | Admitting: Cardiovascular Disease

## 2010-12-22 ENCOUNTER — Ambulatory Visit (INDEPENDENT_AMBULATORY_CARE_PROVIDER_SITE_OTHER): Payer: 59 | Admitting: Cardiovascular Disease

## 2010-12-22 ENCOUNTER — Encounter: Payer: Self-pay | Admitting: Cardiovascular Disease

## 2010-12-22 DIAGNOSIS — E785 Hyperlipidemia, unspecified: Secondary | ICD-10-CM

## 2010-12-22 DIAGNOSIS — I10 Essential (primary) hypertension: Secondary | ICD-10-CM

## 2010-12-22 DIAGNOSIS — I2510 Atherosclerotic heart disease of native coronary artery without angina pectoris: Secondary | ICD-10-CM

## 2010-12-22 NOTE — Assessment & Plan Note (Signed)
Blood pressure is well controlled on clonidine and labetalol. The patient has a history of syncope but he has had no recent problems.

## 2010-12-22 NOTE — Progress Notes (Signed)
HPI:  The patient is a 45 year old gentleman presenting for followup evaluation. He has familial hyperlipidemia and hypertension. The patient has a history of chest pain and he underwent cardiac catheterization in 2009 showing no significant coronary artery disease. He is intolerant to statin drugs and he had rhabdomyolysis with TriCor. He is managed with high-dose niacin. Overall the patient has been doing well since I saw him last. He has developed a skin reaction to his clonidine patch but he describes this as fairly mild and that's okay as long as he moves his patch frequently. He denies chest pain or dyspnea. He has no edema or palpitations.  His lipids have been followed by Dr. Renae Gloss and he has also been seen in the past by Dr. Eligah East at [facility]. Lipids from May 2012 showed a cholesterol of 294, triglycerides 112, HDL 77, and LDL 195.  Outpatient Encounter Prescriptions as of 12/22/2010  Medication Sig Dispense Refill  . amitriptyline (ELAVIL) 75 MG tablet Take 75 mg by mouth at bedtime.        Marland Kitchen aspirin 81 MG tablet Take 81 mg by mouth daily.        . cloNIDine (CATAPRES - DOSED IN MG/24 HR) 0.2 mg/24hr patch Place 1 patch onto the skin once a week.        . esomeprazole (NEXIUM) 40 MG capsule Take 40 mg by mouth 2 (two) times daily.        Marland Kitchen labetalol (NORMODYNE) 200 MG tablet Take 200 mg by mouth 2 (two) times daily.        Marland Kitchen loratadine-pseudoephedrine (CLARITIN-D 12-HOUR) 5-120 MG per tablet Take 1 tablet by mouth as needed.        . niacin 500 MG CR capsule Take 2000mg  2 x a day      . oxyCODONE-acetaminophen (PERCOCET) 5-325 MG per tablet Take 1 tablet by mouth every 4 (four) hours as needed.        . sertraline (ZOLOFT) 100 MG tablet Take 100 mg by mouth daily.          Allergies  Allergen Reactions  . Diphenhydramine Hcl   . Fibrates   . Latex   . Statins     Past Medical History  Diagnosis Date  . Chest pain   . Hypertension   . Hyperlipidemia   . Renal calculus     hx of   . Depression   . Rhabdomyolysis   . Posttraumatic stress disorder \  . Peptic ulcer, unspecified site, unspecified as acute or chronic, without mention of hemorrhage, perforation, or obstruction     ROS: Negative except as per HPI  BP 126/88  Pulse 65  Resp 18  Ht 5\' 11"  (1.803 m)  Wt 193 lb (87.544 kg)  BMI 26.92 kg/m2  PHYSICAL EXAM: Pt is alert and oriented, NAD HEENT: normal Neck: JVP - normal, carotids 2+= without bruits Lungs: CTA bilaterally CV: RRR without murmur or gallop Abd: soft, NT, Positive BS, no hepatomegaly Ext: no C/C/E, distal pulses intact and equal Skin: warm/dry no rash  EKG:  Normal sinus rhythm 67 beats per minute, within normal limits.  ASSESSMENT AND PLAN:

## 2010-12-22 NOTE — Patient Instructions (Signed)
Your physician wants you to follow-up in: 1 YEAR.  You will receive a reminder letter in the mail two months in advance. If you don't receive a letter, please call our office to schedule the follow-up appointment.  Your physician recommends that you continue on your current medications as directed. Please refer to the Current Medication list given to you today.  

## 2010-12-22 NOTE — Assessment & Plan Note (Signed)
Treatment is limited based on a statin intolerance and history of rhabdomyolysis with fenofibrate. Recommend continue niacin.

## 2011-04-23 ENCOUNTER — Other Ambulatory Visit: Payer: Self-pay | Admitting: Cardiovascular Disease

## 2011-09-14 ENCOUNTER — Other Ambulatory Visit (HOSPITAL_COMMUNITY): Payer: Self-pay | Admitting: Neurological Surgery

## 2011-09-14 DIAGNOSIS — IMO0002 Reserved for concepts with insufficient information to code with codable children: Secondary | ICD-10-CM

## 2011-09-20 ENCOUNTER — Other Ambulatory Visit (HOSPITAL_COMMUNITY): Payer: 59

## 2011-09-21 ENCOUNTER — Ambulatory Visit (HOSPITAL_COMMUNITY)
Admission: RE | Admit: 2011-09-21 | Discharge: 2011-09-21 | Disposition: A | Discharge status: Home / Self Care | Payer: 59 | Source: Ambulatory Visit | Attending: Neurological Surgery | Admitting: Neurological Surgery

## 2011-09-21 DIAGNOSIS — M545 Low back pain, unspecified: Secondary | ICD-10-CM | POA: Insufficient documentation

## 2011-09-21 DIAGNOSIS — D1809 Hemangioma of other sites: Secondary | ICD-10-CM | POA: Insufficient documentation

## 2011-09-21 DIAGNOSIS — G8929 Other chronic pain: Secondary | ICD-10-CM | POA: Insufficient documentation

## 2011-09-21 DIAGNOSIS — IMO0002 Reserved for concepts with insufficient information to code with codable children: Secondary | ICD-10-CM | POA: Insufficient documentation

## 2011-09-21 DIAGNOSIS — R52 Pain, unspecified: Secondary | ICD-10-CM | POA: Insufficient documentation

## 2012-03-19 ENCOUNTER — Emergency Department (HOSPITAL_COMMUNITY)
Admission: EM | Admit: 2012-03-19 | Discharge: 2012-03-19 | Disposition: A | Discharge status: Home / Self Care | Payer: PRIVATE HEALTH INSURANCE | Attending: Emergency Medicine | Admitting: Emergency Medicine

## 2012-03-19 ENCOUNTER — Encounter (HOSPITAL_COMMUNITY): Payer: Self-pay | Admitting: Nurse Practitioner

## 2012-03-19 DIAGNOSIS — Z87442 Personal history of urinary calculi: Secondary | ICD-10-CM | POA: Insufficient documentation

## 2012-03-19 DIAGNOSIS — F3289 Other specified depressive episodes: Secondary | ICD-10-CM | POA: Insufficient documentation

## 2012-03-19 DIAGNOSIS — Z8679 Personal history of other diseases of the circulatory system: Secondary | ICD-10-CM | POA: Insufficient documentation

## 2012-03-19 DIAGNOSIS — E785 Hyperlipidemia, unspecified: Secondary | ICD-10-CM | POA: Insufficient documentation

## 2012-03-19 DIAGNOSIS — W460XXA Contact with hypodermic needle, initial encounter: Secondary | ICD-10-CM | POA: Insufficient documentation

## 2012-03-19 DIAGNOSIS — W461XXA Contact with contaminated hypodermic needle, initial encounter: Secondary | ICD-10-CM

## 2012-03-19 DIAGNOSIS — Y93F9 Activity, other caregiving: Secondary | ICD-10-CM | POA: Insufficient documentation

## 2012-03-19 DIAGNOSIS — Z7982 Long term (current) use of aspirin: Secondary | ICD-10-CM | POA: Insufficient documentation

## 2012-03-19 DIAGNOSIS — S61209A Unspecified open wound of unspecified finger without damage to nail, initial encounter: Secondary | ICD-10-CM | POA: Insufficient documentation

## 2012-03-19 DIAGNOSIS — Z79899 Other long term (current) drug therapy: Secondary | ICD-10-CM | POA: Insufficient documentation

## 2012-03-19 DIAGNOSIS — Z8659 Personal history of other mental and behavioral disorders: Secondary | ICD-10-CM | POA: Insufficient documentation

## 2012-03-19 DIAGNOSIS — I10 Essential (primary) hypertension: Secondary | ICD-10-CM | POA: Insufficient documentation

## 2012-03-19 DIAGNOSIS — Z8739 Personal history of other diseases of the musculoskeletal system and connective tissue: Secondary | ICD-10-CM | POA: Insufficient documentation

## 2012-03-19 DIAGNOSIS — Y9210 Unspecified residential institution as the place of occurrence of the external cause: Secondary | ICD-10-CM | POA: Insufficient documentation

## 2012-03-19 DIAGNOSIS — F329 Major depressive disorder, single episode, unspecified: Secondary | ICD-10-CM | POA: Insufficient documentation

## 2012-03-19 DIAGNOSIS — Z8711 Personal history of peptic ulcer disease: Secondary | ICD-10-CM | POA: Insufficient documentation

## 2012-03-19 NOTE — ED Notes (Signed)
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 Minimal pain, cms intact. Flooded stick with water immediately, cleaned with alcohol prep and placed 2x2 gauze to control bleeding.

## 2012-03-19 NOTE — ED Provider Notes (Signed)
History   This chart was scribed for Wynetta Emery, PA by Leone Payor, ED Scribe. This patient was seen in room TR04C/TR04C and the patient's care was started at 1946  CSN: [id number]  Arrival date & time 03/19/12  7829   First MD Initiated Contact with Patient 03/19/12 1946      Chief Complaint  Patient presents with  . Body Fluid Exposure     The history is provided by the patient. No language interpreter was used.    [name] is a 47 y.o. male who presents to the Emergency Department complaining of body fluid exposure after an employee stuck the tip of right index finger with a needle after drawing blood from an ER pt. patient states he feels the needle went quite deep and contacted bone. Pt has minimal pain. Patient declined post exposure prophylaxis at this time. Rapid HIV test of the source patient was negative. Hepatitis still pending. Patient immediately irrigated the affected area with water and applied alcohol and bleach wipe.  + - Pt has h/o HTN, HLD.  Pt denies smoking and alcohol use.  Past Medical History  Diagnosis Date  . Chest pain   . Hypertension   . Hyperlipidemia   . Renal calculus     hx of  . Depression   . Rhabdomyolysis   . Posttraumatic stress disorder \  . Peptic ulcer, unspecified site, unspecified as acute or chronic, without mention of hemorrhage, perforation, or obstruction     Past Surgical History  Procedure Date  . Nasal sinus surgery   . Shoulder surgery   . Hemorrhoid surgery     Family History  Problem Relation Age of Onset  . Alzheimer's disease Mother 81    died  . Hyperlipidemia Father 57    alive -and back probldems  . Hematuria Brother 43    History  Substance Use Topics  . Smoking status: Never Smoker   . Smokeless tobacco: Not on file     Comment: pt. chew 2 packs of tobacco a week  . Alcohol Use: No      Review of Systems  Constitutional: Negative for fever.  Respiratory: Negative for shortness of  breath.   Cardiovascular: Negative for chest pain.  Gastrointestinal: Negative for nausea, vomiting, abdominal pain and diarrhea.  Skin: Positive for wound.  All other systems reviewed and are negative.    Allergies  Diphenhydramine hcl; Fibrates; Latex; and Statins  Home Medications   Current Outpatient Rx  Name  Route  Sig  Dispense  Refill  . AMITRIPTYLINE HCL 75 MG PO TABS   Oral   Take 75 mg by mouth at bedtime.           . ASPIRIN 81 MG PO TABS   Oral   Take 81 mg by mouth daily.           Marland Kitchen CATAPRES-TTS-2 0.2 MG/24HR TD PTWK      APPLY ONE PATCH ONCE WEEKLY AS DIRECTED   12 each   4   . ESOMEPRAZOLE MAGNESIUM 40 MG PO CPDR   Oral   Take 40 mg by mouth 2 (two) times daily.           Marland Kitchen LABETALOL HCL 200 MG PO TABS   Oral   Take 200 mg by mouth 2 (two) times daily.           Marland Kitchen LORATADINE-PSEUDOEPHEDRINE ER 5-120 MG PO TB12   Oral   Take 1 tablet by mouth  as needed.           Marland Kitchen NIACIN ER 500 MG PO CPCR      Take 2000mg  2 x a day         . OXYCODONE-ACETAMINOPHEN 5-325 MG PO TABS   Oral   Take 1 tablet by mouth every 4 (four) hours as needed.           . SERTRALINE HCL 100 MG PO TABS   Oral   Take 100 mg by mouth daily.             BP 140/98  Pulse 96  Temp 97.7 F (36.5 C) (Oral)  Resp 15  SpO2 96%  Physical Exam  Nursing note and vitals reviewed. Constitutional: He is oriented to person, place, and time. He appears well-developed and well-nourished. No distress.  HENT:  Head: Normocephalic.  Eyes: Conjunctivae normal and EOM are normal.  Cardiovascular: Normal rate.   Pulmonary/Chest: Effort normal. No stridor.  Musculoskeletal: Normal range of motion.  Neurological: He is alert and oriented to person, place, and time.  Skin:       Bleeding controlled. pinprick to right second digit pad of the finger.  Psychiatric: He has a normal mood and affect.    ED Course  Procedures (including critical care time)  DIAGNOSTIC  STUDIES: Oxygen Saturation is 96% on room air, adequate by my interpretation.    COORDINATION OF CARE:   7:56 PMDiscussed treatment plan which includespt at bedside and pt agreed to plan.    Labs Reviewed - No data to display No results found.    1. Exposure to body fluids by contaminated hypodermic needle stick       MDM  patient has been counseled on post exposure prophylaxis he is been counseled on post exposure prophylaxis he defers at this time.   I personally performed the services described in this documentation, which was scribed in my presence. The recorded information has been reviewed and is accurate.   Wynetta Emery, PA-C 03/20/12 1010

## 2012-03-19 NOTE — ED Notes (Signed)
Pt states understanding of discharge instructions 

## 2012-03-20 NOTE — ED Provider Notes (Signed)
Medical screening examination/treatment/procedure(s) were performed by non-physician practitioner and as supervising physician I was immediately available for consultation/collaboration.   Charlean Carneal E Kaisley Stiverson, MD 03/20/12 1115 

## 2012-04-22 ENCOUNTER — Other Ambulatory Visit: Payer: Self-pay

## 2012-07-07 ENCOUNTER — Encounter: Payer: Self-pay | Admitting: Cardiovascular Disease

## 2012-07-07 ENCOUNTER — Ambulatory Visit (INDEPENDENT_AMBULATORY_CARE_PROVIDER_SITE_OTHER): Payer: 59 | Admitting: Cardiovascular Disease

## 2012-07-07 VITALS — BP 136/96 | HR 60 | Ht 71.0 in | Wt 188.0 lb

## 2012-07-07 DIAGNOSIS — I10 Essential (primary) hypertension: Secondary | ICD-10-CM

## 2012-07-07 MED ORDER — AMLODIPINE BESYLATE 5 MG PO TABS: 5.0000 mg | ORAL_TABLET | Freq: Every day | ORAL | Status: DC

## 2012-07-07 NOTE — Patient Instructions (Signed)
Your physician has recommended you make the following change in your medication: START Amlodipine 5mg  take one by mouth daily  Your physician recommends that you have lab work today: LIPID and LIVER profile  Your physician wants you to follow-up in: 6 MONTHS with Dr Excell Seltzer.  You will receive a reminder letter in the mail two months in advance. If you don't receive a letter, please call our office to schedule the follow-up appointment.

## 2012-07-07 NOTE — Progress Notes (Signed)
HPI:  The patient is a 47 year old gentleman presenting for followup evaluation. He has familial hyperlipidemia and hypertension. The patient has a history of chest pain and he underwent cardiac catheterization in 2009 showing no significant coronary artery disease. He is intolerant to statin drugs and he had rhabdomyolysis with TriCor.  He has had to stop several meds. Stopped Niacin because of major problems with gout and is now doing better. Stopped clonidine patch because of a skin reaction. BP has been running high for about 6 weeks now. Diastolics are often greater than .  No chest pain or dyspnea. No palpitations, edema, orthopnea, or PND.  Outpatient Encounter Prescriptions as of 07/07/2012  Medication Sig Dispense Refill  . aspirin 81 MG tablet Take 81 mg by mouth daily.      Marland Kitchen labetalol (NORMODYNE) 200 MG tablet Take 200 mg by mouth 2 (two) times daily.        Marland Kitchen loratadine-pseudoephedrine (CLARITIN-D 12-HOUR) 5-120 MG per tablet Take 1 tablet by mouth daily as needed. For seasonal allergies      . oxyCODONE-acetaminophen (PERCOCET) 5-325 MG per tablet Take 1 tablet by mouth every 4 (four) hours as needed. For pain      . pantoprazole (PROTONIX) 40 MG tablet Take 40 mg by mouth daily.      . [DISCONTINUED] allopurinol (ZYLOPRIM) 100 MG tablet Take 100 mg by mouth at bedtime.      . [DISCONTINUED] amitriptyline (ELAVIL) 75 MG tablet Take 75 mg by mouth at bedtime.        . [DISCONTINUED] cloNIDine (CATAPRES - DOSED IN MG/24 HR) 0.2 mg/24hr patch Place 1 patch onto the skin every 7 (seven) days. Fridays      . [DISCONTINUED] esomeprazole (NEXIUM) 40 MG capsule Take 40 mg by mouth 2 (two) times daily.        . [DISCONTINUED] sertraline (ZOLOFT) 100 MG tablet Take 100 mg by mouth at bedtime.        No facility-administered encounter medications on file as of 07/07/2012.    Allergies  Allergen Reactions  . Diphenhydramine Hcl   . Latex   . Statins     Past Medical History    Diagnosis Date  . Chest pain   . Hypertension   . Hyperlipidemia   . Renal calculus     hx of  . Depression   . Rhabdomyolysis   . Posttraumatic stress disorder \  . Peptic ulcer, unspecified site, unspecified as acute or chronic, without mention of hemorrhage, perforation, or obstruction     ROS: Negative except as per HPI  BP 136/96  Pulse 60  Ht 5\' 11"  (1.803 m)  Wt 85.276 kg (188 lb)  BMI 26.23 kg/m2  SpO2 97%  PHYSICAL EXAM: Pt is alert and oriented, NAD HEENT: normal Neck: JVP - normal, carotids 2+= without bruits Lungs: CTA bilaterally CV: RRR without murmur or gallop Abd: soft, NT, Positive BS, no hepatomegaly Ext: no C/C/E, distal pulses intact and equal Skin: warm/dry no rash  EKG:  NSR 60 bpm, within normal limits.  ASSESSMENT AND PLAN: 1. Hyperlipidemia. Now off of all meds. Difficult because of intolerance to statins, rhabdo with fibrates, and now intolerant to niacin. Will repeat labs but treatment options will be limited.  2. HTN - suboptimal control. Add norvasc 5 mg. Reluctant to use thiazide diuretic because of gout.  Asked him to call if BP remains above 140/90. He works in the ER and checks BP regularly. Otherwise will see back in  6 months. I will call him with lipid results and review treatment options.  Tonny Bollman 07/07/2012 11:45 AM

## 2012-07-10 LAB — LIPID PANEL: Cholesterol: 297 mg/dL — ABNORMAL HIGH (ref 0–200)

## 2012-07-10 LAB — HEPATIC FUNCTION PANEL
ALT: 22 U/L (ref 0–53)
AST: 27 U/L (ref 0–37)
Albumin: 4.3 g/dL (ref 3.5–5.2)
Alkaline Phosphatase: 51 U/L (ref 39–117)
Total Protein: 7.8 g/dL (ref 6.0–8.3)

## 2013-01-11 ENCOUNTER — Other Ambulatory Visit: Payer: Self-pay

## 2013-03-08 HISTORY — PX: COLONOSCOPY: SHX174

## 2013-03-14 ENCOUNTER — Encounter: Payer: Self-pay | Admitting: Cardiovascular Disease

## 2013-03-14 ENCOUNTER — Ambulatory Visit (INDEPENDENT_AMBULATORY_CARE_PROVIDER_SITE_OTHER): Payer: 59 | Admitting: Cardiovascular Disease

## 2013-03-14 VITALS — BP 140/96 | HR 82 | Ht 71.0 in | Wt 186.4 lb

## 2013-03-14 DIAGNOSIS — E785 Hyperlipidemia, unspecified: Secondary | ICD-10-CM

## 2013-03-14 DIAGNOSIS — I10 Essential (primary) hypertension: Secondary | ICD-10-CM

## 2013-03-14 MED ORDER — EZETIMIBE 10 MG PO TABS: 10.0000 mg | ORAL_TABLET | Freq: Every day | ORAL | Status: DC

## 2013-03-14 MED ORDER — LABETALOL HCL 200 MG PO TABS: 200.0000 mg | ORAL_TABLET | Freq: Two times a day (BID) | ORAL | Status: DC

## 2013-03-14 MED ORDER — PANTOPRAZOLE SODIUM 40 MG PO TBEC: 40.0000 mg | DELAYED_RELEASE_TABLET | Freq: Every day | ORAL | Status: DC

## 2013-03-14 NOTE — Patient Instructions (Addendum)
Your physician has recommended you make the following change in your medication:  Start Zetia 10 mg once daily  Refills for Protonix and Labetolol have been sent to your pharmacy   Your physician wants you to follow-up in: 1 year with Dr. Burt Knack.  You will receive a reminder letter in the mail two months in advance. If you don't receive a letter, please call our office to schedule the follow-up appointment.  Your Primary Care Physician will continue to follow your cholesterol and other lab work  You have an appointment with Alferd Apa, PharmD on Wed. 1/14 at 11:00 for lipid clinic

## 2013-03-14 NOTE — Progress Notes (Signed)
    HPI:  48 year-old gentleman returning for follow-up evaluation. He's followed for hyperlipidemia.  He has a history of chest pain and had a normal cardiac catheterization several years ago. He has fairly marked hyperlipidemia but has been intolerant to statin drugs. He's had gout associated with niacin. He had severe rhabdomyolysis with TriCor. He is currently not on any medication. He brings in his most recent lab work from November 2014 demonstrating a cholesterol of 328, HDL 65, LDL 237, and triglycerides 130.  From a symptomatic perspective he is doing well. He's lost weight by changing some dietary habits. He denies chest pain, chest pressure, shortness of breath, or edema. Blood pressure at home has been ranging less than 140/90.  Outpatient Encounter Prescriptions as of 03/14/2013  Medication Sig  . amLODipine (NORVASC) 5 MG tablet Take 1 tablet (5 mg total) by mouth daily.  Marland Kitchen buPROPion (WELLBUTRIN SR) 150 MG 12 hr tablet Take 150 mg by mouth daily.  . cyclobenzaprine (FLEXERIL) 10 MG tablet Take 10 mg by mouth 3 (three) times daily as needed for muscle spasms.  . eszopiclone (LUNESTA) 2 MG TABS tablet Take 2 mg by mouth at bedtime as needed for sleep. Take immediately before bedtime  . labetalol (NORMODYNE) 200 MG tablet Take 200 mg by mouth 2 (two) times daily.    Marland Kitchen loratadine-pseudoephedrine (CLARITIN-D 12-HOUR) 5-120 MG per tablet Take 1 tablet by mouth daily as needed. For seasonal allergies  . oxyCODONE-acetaminophen (PERCOCET) 5-325 MG per tablet Take 1 tablet by mouth every 4 (four) hours as needed. For pain  . pantoprazole (PROTONIX) 40 MG tablet Take 40 mg by mouth daily.  . [DISCONTINUED] aspirin 81 MG tablet Take 81 mg by mouth daily.    Allergies  Allergen Reactions  . Diphenhydramine Hcl   . Latex   . Statins     Past Medical History  Diagnosis Date  . Chest pain   . Hypertension   . Hyperlipidemia   . Renal calculus     hx of  . Depression   . Rhabdomyolysis    . Posttraumatic stress disorder \  . Peptic ulcer, unspecified site, unspecified as acute or chronic, without mention of hemorrhage, perforation, or obstruction     ROS: Negative except as per HPI  BP 140/96  Pulse 82  Ht 5\' 11"  (1.803 m)  Wt 186 lb 6.4 oz (84.55 kg)  BMI 26.01 kg/m2  PHYSICAL EXAM: Pt is alert and oriented, NAD HEENT: normal Neck: JVP - normal, carotids 2+= without bruits Lungs: CTA bilaterally CV: RRR without murmur or gallop Abd: soft, NT, Positive BS, no hepatomegaly Ext: no C/C/E, distal pulses intact and equal Skin: warm/dry no rash  EKG:  Normal sinus rhythm 82 beats per minute, within normal limits.  ASSESSMENT AND PLAN: 1. Hyperlipidemia. Lipids as above. Very limited treatment options with statin intolerance, inability to use rates because of rhabdomyolysis, and inability to take niacin. Will try zetia 10 mg daily. He has a followup with Dr. Karlton Lemon in may and labs will be checked at that time.  2. Hypertension. Blood pressure control is borderline. He does not want to take more medication at this point. Will continue with amlodipine and labetalol.  For followup I will see him back in one year. Will review his labs when they're done in May.  Sherren Mocha 03/14/2013 2:04 PM

## 2013-03-15 ENCOUNTER — Ambulatory Visit: Payer: Self-pay | Admitting: Cardiovascular Disease

## 2013-03-21 ENCOUNTER — Ambulatory Visit (INDEPENDENT_AMBULATORY_CARE_PROVIDER_SITE_OTHER): Payer: 59 | Admitting: Pharmacist

## 2013-03-21 VITALS — Wt 186.0 lb

## 2013-03-21 DIAGNOSIS — E785 Hyperlipidemia, unspecified: Secondary | ICD-10-CM

## 2013-03-21 MED ORDER — FISH OIL 1000 MG PO CAPS: 1000.0000 mg | ORAL_CAPSULE | Freq: Every day | ORAL | Status: DC

## 2013-03-21 NOTE — Patient Instructions (Signed)
1.  Continue Zetia 10 mg qday. 2.  Start 1,000 mg of fish oil daily - mother history of alzhiemers at early age 50.  Checking cholesterol, liver, and CK by PCP in 07/2013, and will come see me in 08/2013 to review and discuss options. 4.  If cholesterol still elevated at that time, and CK is normal, I suggest starting ONCE weekly Crestor 10 mg so likely achieve another 20% reduction since he is an over producer of LDL. Baseline CK ~ 135. 5.  Continue dietary improvements.  6.  If statin intolerant arm of PCSK-9 inhibitor clinical trial opens up, I will contact you. 7.  Call me if problems between now and June's appointment.

## 2013-03-21 NOTE — Progress Notes (Signed)
Patient referred to lipid clinic by Dr. 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  His baseline LDL is > 230 mg/dL and has a h/o TC of > 400 mg/dL he tells me.  Patient had a heart cath in 2009 which showed no evidence of disease.  Patient started Zetia 10 mg qd one week ago today, and is tolerating this medication well so far.  He tells me he had some genotyping done on his blood 5 years ago through [facility] but didn't bring the blood work today.  He agrees to bring that with him next time he comes.  I'm curious to see his ApoE genotype and is there is an elevated Lp(a).  He states the blood work stated his cholesterol was 90% genetic, and 10% from a dietary source based on blood work (this seems reasonable given such a high baseline LDL).  Dr. Karlton Lemon typically draws his blood work and then he brings the copies to [place] to manage his cholesterol.  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  Of note, he tells me 5 years ago before he hurt his back, his weight was down to 160 lbs, but yet his cholesterol was still as high as it is today.  Risk factors:  HTN, age, baseline LDL > 190 mg/dL - LDL goal is 50% reduction (thus LDL goal of < 115 mg/dL) Meds:  Zetia 10 mg qd - started 03/14/13 Intolerant:  Multiple daily statins, including Crestor daily (muscle aches), Tricor (rhabdomyolysis), niacin (induced gout) Never tried:  Weekly statin, Welchol  Family history:  Mother- HTN, Alzheimer's at early age (late 75's), No heart disease;  Father - elevated cholesterol, but no heart disease.  Grandparents - both had diet controlled diabetes.  Of note, with exception of his mother who died young with Alzheimer's, most of his family members lived a long life into their late 38s or 48s. Social history:  Rarely drinks alcohol, non-smokers, works in the ER at night  Diet:  Patient  tells me he has cut down on his caloric intake at night.  He use to eat a lot of snacks late at night, and has been losing weight since stopping this.  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  Typically eats a healthy option at hospital cafeteria for lunch, and then a low fat meal at home for dinner.  He does drink 3-4 sodas per day. Exercise:  Hurt his back years ago and has been limited.  He is discussing with his neurosurgeon being able to start the exercise bike again.  Labs:   01/2013  TC 328, HDL 65, LDL 237, TG 160 (not on lipid lowering therapy) - He started Zetia 10 mg qd on 03/14/13 by Cardiologist.  Current Outpatient Prescriptions  Medication Sig Dispense Refill  . amLODipine (NORVASC) 5 MG tablet Take 1 tablet (5 mg total) by mouth daily.  90 tablet  3  . buPROPion (WELLBUTRIN SR) 150 MG 12 hr tablet Take 150 mg by mouth daily.      . cyclobenzaprine (FLEXERIL) 10 MG tablet Take 10 mg by mouth 3 (three) times daily as needed for muscle spasms.      . eszopiclone (LUNESTA) 2 MG TABS tablet Take 2 mg by mouth at bedtime as needed for sleep. Take immediately before bedtime      .  ezetimibe (ZETIA) 10 MG tablet Take 1 tablet (10 mg total) by mouth daily.  90 tablet  3  . labetalol (NORMODYNE) 200 MG tablet Take 1 tablet (200 mg total) by mouth 2 (two) times daily.  180 tablet  3  . loratadine-pseudoephedrine (CLARITIN-D 12-HOUR) 5-120 MG per tablet Take 1 tablet by mouth daily as needed. For seasonal allergies      . oxyCODONE-acetaminophen (PERCOCET) 5-325 MG per tablet Take 1 tablet by mouth every 4 (four) hours as needed. For pain      . pantoprazole (PROTONIX) 40 MG tablet Take 1 tablet (40 mg total) by mouth daily.  90 tablet  3   No current facility-administered medications for this visit.   Allergies  Allergen Reactions  . Diphenhydramine Hcl   . Latex   . Statins   . Tricor [Fenofibrate]     rhabdomyolysis   Family History  Problem  Relation Age of Onset  . Alzheimer's disease Mother 25    died  . Hyperlipidemia Father 73    alive -and back probldems  . Hematuria Brother 48

## 2013-03-21 NOTE — Assessment & Plan Note (Addendum)
Given he is primary prevention, and patient's h/o rhabdomylosis with Tricor, and multiple failures with daily statins, will not therapy too aggressively at this time.  He is tolerating Zetia well for past week, so will wait until his next panel is drawn in 07/2013 (with PCP) and then meet with me in June.  He will bring his genetic blue print that was done by [facility] 5 years ago and I will review this to see if there is any useful data we can use to try and augment therapy appropriately.  Will hope to see his ApoE genotype, Lp(a), SLCO1B1 genotype, etc to see if certain meds or diets would be of more benefit to him.  If cholesterol remains elevated in June, and CK and liver are normal from Dr. Karlton Lemon, will likely try and start weekly Crestor 10 mg qd.  If we do this, we would likely need to check a CK ~ 1 month after starting given his history.  We discussed this in clinic today.  We also discussed using PCSK-9 inhibitor possibly if a statin intolerant arm opens up - I will call him if this happens.   He is not a candidate for lomitapide or mipomersen at this time.  Patient will continue his dietary and weight loss efforts. Plan: 1.  Continue Zetia 10 mg qday as only been on for 1 week, and need to see what type of reduction we will get with it. 2.  Start 1,000 mg of fish oil daily - mother history of alzhiemers at early age 61.  Checking cholesterol, liver, and CK by PCP in 07/2013, and will come see me in 08/2013 to review and discuss options. 4.  If cholesterol still elevated at that time, and CK is normal, I suggest starting ONCE weekly Crestor 10 mg so likely achieve another 20% reduction since he is an over producer of LDL. Baseline CK ~ 135. 5.  Continue dietary improvements.  6.  If statin intolerant arm of PCSK-9 inhibitor clinical trial opens up, I will contact you. 7.  Call me if problems between now and June's appointment.

## 2013-06-28 ENCOUNTER — Other Ambulatory Visit: Payer: Self-pay

## 2013-06-28 DIAGNOSIS — I10 Essential (primary) hypertension: Secondary | ICD-10-CM

## 2013-06-28 MED ORDER — AMLODIPINE BESYLATE 5 MG PO TABS: 5.0000 mg | ORAL_TABLET | Freq: Every day | ORAL | Status: DC

## 2013-08-03 ENCOUNTER — Telehealth: Payer: Self-pay | Admitting: Gastroenterology

## 2013-08-03 NOTE — Telephone Encounter (Signed)
Dr Ardis Hughs should he have office visit to discuss?

## 2013-08-03 NOTE — Telephone Encounter (Signed)
Needs ngi apt to discuss polyp surveillance.  He had single small TA removed 2009

## 2013-08-06 NOTE — Telephone Encounter (Signed)
I have spoken to patient and have advised that he should come for office visit with Dr Ardis Hughs to discuss polyp surveillance. He verbalizes understanding and has scheduled an appointment with Dr Ardis Hughs on 10/05/13.

## 2013-08-06 NOTE — Telephone Encounter (Signed)
Left message to call back  

## 2013-08-14 ENCOUNTER — Ambulatory Visit: Payer: Self-pay | Admitting: Pharmacist

## 2013-08-16 ENCOUNTER — Ambulatory Visit: Payer: Self-pay | Admitting: Pharmacist

## 2013-10-05 ENCOUNTER — Encounter: Payer: Self-pay | Admitting: Gastroenterology

## 2013-10-05 ENCOUNTER — Ambulatory Visit (INDEPENDENT_AMBULATORY_CARE_PROVIDER_SITE_OTHER): Payer: 59 | Admitting: Gastroenterology

## 2013-10-05 VITALS — BP 116/74 | HR 80 | Ht 70.5 in | Wt 185.1 lb

## 2013-10-05 DIAGNOSIS — Z86010 Personal history of colonic polyps: Secondary | ICD-10-CM

## 2013-10-05 DIAGNOSIS — K5900 Constipation, unspecified: Secondary | ICD-10-CM

## 2013-10-05 MED ORDER — PEG-KCL-NACL-NASULF-NA ASC-C 100 G PO SOLR: 1.0000 | Freq: Once | ORAL | Status: DC

## 2013-10-05 NOTE — Progress Notes (Signed)
Review of pertinent gastrointestinal problems: 1. Adenomatous colon polyp, small [facility] Colonoscopy 03/2007; recall recommended 5 years.  HPI: This is a   pleasant 48 year old man whom I last saw several years ago.  Has been very constipated for about a week.  Took a bottle of mag citrate last night.  Has been very effective.  This has happened about twice per month.  Usually will take stool softner or laxative.  Very infrequent blood in his stool  Was hemocult + this past week.    Review of systems: Pertinent positive and negative review of systems were noted in the above HPI section. Complete review of systems was performed and was otherwise normal.    Past Medical History  Diagnosis Date  . Chest pain   . Hypertension   . Hyperlipidemia   . Renal calculus     hx of  . Depression   . Rhabdomyolysis   . Posttraumatic stress disorder \  . Peptic ulcer, unspecified site, unspecified as acute or chronic, without mention of hemorrhage, perforation, or obstruction   . Adenomatous colon polyp   . Helicobacter pylori (H. pylori)     Past Surgical History  Procedure Laterality Date  . Nasal sinus surgery    . Shoulder surgery    . Hemorrhoid surgery      Current Outpatient Prescriptions  Medication Sig Dispense Refill  . amLODipine (NORVASC) 5 MG tablet Take 1 tablet (5 mg total) by mouth daily.  90 tablet  3  . buPROPion (WELLBUTRIN SR) 150 MG 12 hr tablet Take 150 mg by mouth daily.      . cyclobenzaprine (FLEXERIL) 10 MG tablet Take 10 mg by mouth 3 (three) times daily as needed for muscle spasms.      . eszopiclone (LUNESTA) 2 MG TABS tablet Take 2 mg by mouth at bedtime as needed for sleep. Take immediately before bedtime      . ezetimibe (ZETIA) 10 MG tablet Take 1 tablet (10 mg total) by mouth daily.  90 tablet  3  . labetalol (NORMODYNE) 200 MG tablet Take 1 tablet (200 mg total) by mouth 2 (two) times daily.  180 tablet  3  . loratadine-pseudoephedrine (CLARITIN-D  12-HOUR) 5-120 MG per tablet Take 1 tablet by mouth daily as needed. For seasonal allergies      . Omega-3 Fatty Acids (FISH OIL) 1000 MG CAPS Take 1 capsule (1,000 mg total) by mouth daily.    0  . oxyCODONE-acetaminophen (PERCOCET) 5-325 MG per tablet Take 1 tablet by mouth every 4 (four) hours as needed. For pain      . pantoprazole (PROTONIX) 40 MG tablet Take 1 tablet (40 mg total) by mouth daily.  90 tablet  3   No current facility-administered medications for this visit.    Allergies as of 10/05/2013 - Review Complete 10/05/2013  Allergen Reaction Noted  . Bio-statin [nystatin]  10/05/2013  . Diphenhydramine hcl  06/10/2008  . Latex  06/10/2008  . Statins  06/10/2008  . Tricor [fenofibrate]  03/21/2013    Family History  Problem Relation Age of Onset  . Alzheimer's disease Mother 49    died  . Hyperlipidemia Father 38    alive -and back probldems  . Hematuria Brother 53    History   Social History  . Marital Status: Single    Spouse Name: N/A    Number of Children: N/A  . Years of Education: N/A   Occupational History  . works ED  Social History Main Topics  . Smoking status: Never Smoker   . Smokeless tobacco: Not on file     Comment: pt. chew 2 packs of tobacco a week  . Alcohol Use: No  . Drug Use: No  . Sexual Activity: Not on file   Other Topics Concern  . Not on file   Social History Narrative  . No narrative on file       Physical Exam: Ht 5' 10.5" (1.791 m)  Wt 185 lb 2 oz (83.972 kg)  BMI 26.18 kg/m2 Constitutional: generally well-appearing Psychiatric: alert and oriented x3 Eyes: extraocular movements intact Mouth: oral pharynx moist, no lesions Neck: supple no lymphadenopathy Cardiovascular: heart regular rate and rhythm Lungs: clear to auscultation bilaterally Abdomen: soft, nontender, nondistended, no obvious ascites, no peritoneal signs, normal bowel sounds Extremities: no lower extremity edema bilaterally Skin: no lesions on  visible extremities    Assessment and plan: 48 y.o. male with  personal history of adenomatous colon polyp, recent change in bowels with intermittent constipation  I recommended that he start daily fiber supplements for his intermittent constipation. He is due for repeat colonoscopy for polyp surveillance around now and we will set that up for him.

## 2013-10-05 NOTE — Patient Instructions (Signed)
Please start taking citrucel (orange flavored) powder fiber supplement.  This may cause some bloating at first but that usually goes away. Begin with a small spoonful and work your way up to a large, heaping spoonful daily over a week. You will be set up for a colonoscopy for polyp surveillance.

## 2013-10-10 ENCOUNTER — Ambulatory Visit (AMBULATORY_SURGERY_CENTER): Payer: 59 | Admitting: Gastroenterology

## 2013-10-10 ENCOUNTER — Encounter: Payer: Self-pay | Admitting: Gastroenterology

## 2013-10-10 VITALS — BP 112/73 | HR 55 | Temp 98.2°F | Resp 16 | Ht 70.5 in | Wt 185.0 lb

## 2013-10-10 DIAGNOSIS — D126 Benign neoplasm of colon, unspecified: Secondary | ICD-10-CM

## 2013-10-10 DIAGNOSIS — Z86010 Personal history of colonic polyps: Secondary | ICD-10-CM

## 2013-10-10 MED ORDER — SODIUM CHLORIDE 0.9 % IV SOLN: 500.0000 mL | INTRAVENOUS | Status: DC

## 2013-10-10 NOTE — Op Note (Signed)
[place]  [address] [place], [id number]   COLONOSCOPY PROCEDURE REPORT  PATIENT: Gevin, Perea  MR#: [id number] BIRTHDATE: 24-Jul-1965 , 48  yrs. old GENDER: Male ENDOSCOPIST: Milus Banister, MD PROCEDURE DATE:  10/10/2013 PROCEDURE:   Colonoscopy with snare polypectomy First Screening Colonoscopy - Avg.  risk and is 50 yrs.  old or older - No.  Prior Negative Screening - Now for repeat screening. N/A  History of Adenoma - Now for follow-up colonoscopy & has been > or = to 3 yrs.  Yes hx of adenoma.  Has been 3 or more years since last colonoscopy.  Polyps Removed Today? Yes. ASA CLASS:   Class II INDICATIONS:72mm adenomatous polyp removed 2009. MEDICATIONS: MAC sedation, administered by CRNA and Propofol (Diprivan) 370 mg IV  DESCRIPTION OF PROCEDURE:   After the risks benefits and alternatives of the procedure were thoroughly explained, informed consent was obtained.  A digital rectal exam revealed no abnormalities of the rectum.   The LB [id number]  endoscope was introduced through the anus and advanced to the cecum, which was identified by both the appendix and ileocecal valve. No adverse events experienced.   The quality of the prep was excellent.  The instrument was then slowly withdrawn as the colon was fully examined.  COLON FINDINGS: Four polyps were found, removed and sent to pathology.  These were all sessile, ranged in size from 85mm to 23mm, located in transverse and descending segments, all removed with cold snare.  The examination was otherwise normal.  Retroflexed views revealed no abnormalities. The time to cecum=2 minutes 22 seconds.  Withdrawal time=10 minutes 18 seconds.  The scope was withdrawn and the procedure completed. COMPLICATIONS: There were no complications.  ENDOSCOPIC IMPRESSION: Four polyps were found, removed and sent to pathology. The examination was otherwise normal.  RECOMMENDATIONS: If the polyp(s) removed  today are proven to be adenomatous (pre-cancerous) polyps, you will need a colonoscopy in 3 years. Otherwise you should continue to follow colorectal cancer screening guidelines for "routine risk" patients with a colonoscopy in 10 years.  You will receive a letter within 1-2 weeks with the results of your biopsy as well as final recommendations.  Please call my office if you have not received a letter after 3 weeks.   eSigned:  Milus Banister, MD 10/10/2013 3:49 PM   cc: Crist Infante, MD

## 2013-10-10 NOTE — Progress Notes (Signed)
A/ox3, pleased with MAC, report to RN 

## 2013-10-10 NOTE — Progress Notes (Signed)
Called to room to assist during endoscopic procedure.  Patient ID and intended procedure confirmed with present staff. Received instructions for my participation in the procedure from the performing physician.  

## 2013-10-10 NOTE — Patient Instructions (Signed)
Discharge instructions given with verbal understanding. Handout on polyps place in seal envelope. Resume previous medications. YOU HAD AN ENDOSCOPIC PROCEDURE TODAY AT [facility]: Refer to the procedure report that was given to you for any specific questions about what was found during the examination.  If the procedure report does not answer your questions, please call your gastroenterologist to clarify.  If you requested that your care partner not be given the details of your procedure findings, then the procedure report has been included in a sealed envelope for you to review at your convenience later.  YOU SHOULD EXPECT: Some feelings of bloating in the abdomen. Passage of more gas than usual.  Walking can help get rid of the air that was put into your GI tract during the procedure and reduce the bloating. If you had a lower endoscopy (such as a colonoscopy or flexible sigmoidoscopy) you may notice spotting of blood in your stool or on the toilet paper. If you underwent a bowel prep for your procedure, then you may not have a normal bowel movement for a few days.  DIET: Your first meal following the procedure should be a light meal and then it is ok to progress to your normal diet.  A half-sandwich or bowl of soup is an example of a good first meal.  Heavy or fried foods are harder to digest and may make you feel nauseous or bloated.  Likewise meals heavy in dairy and vegetables can cause extra gas to form and this can also increase the bloating.  Drink plenty of fluids but you should avoid alcoholic beverages for 24 hours.  ACTIVITY: Your care partner should take you home directly after the procedure.  You should plan to take it easy, moving slowly for the rest of the day.  You can resume normal activity the day after the procedure however you should NOT DRIVE or use heavy machinery for 24 hours (because of the sedation medicines used during the test).    SYMPTOMS TO REPORT  IMMEDIATELY: A gastroenterologist can be reached at any hour.  During normal business hours, 8:30 AM to 5:00 PM Monday through Friday, call [phone].  After hours and on weekends, please call the GI answering service at [phone] who will take a message and have the physician on call contact you.   Following lower endoscopy (colonoscopy or flexible sigmoidoscopy):  Excessive amounts of blood in the stool  Significant tenderness or worsening of abdominal pains  Swelling of the abdomen that is new, acute  Fever of 100F or higher FOLLOW UP: If any biopsies were taken you will be contacted by phone or by letter within the next 1-3 weeks.  Call your gastroenterologist if you have not heard about the biopsies in 3 weeks.  Our staff will call the home number listed on your records the next business day following your procedure to check on you and address any questions or concerns that you may have at that time regarding the information given to you following your procedure. This is a courtesy call and so if there is no answer at the home number and we have not heard from you through the emergency physician on call, we will assume that you have returned to your regular daily activities without incident.  SIGNATURES/CONFIDENTIALITY: You and/or your care partner have signed paperwork which will be entered into your electronic medical record.  These signatures attest to the fact that that the information above on your After Visit  Summary has been reviewed and is understood.  Full responsibility of the confidentiality of this discharge information lies with you and/or your care-partner. 

## 2013-10-11 ENCOUNTER — Telehealth: Payer: Self-pay | Admitting: *Deleted

## 2013-10-11 NOTE — Telephone Encounter (Signed)
No answer. Name identifier. Message left to call if any questions or concerns. 

## 2013-10-16 ENCOUNTER — Encounter: Payer: Self-pay | Admitting: Gastroenterology

## 2013-10-18 ENCOUNTER — Telehealth: Payer: Self-pay | Admitting: Pharmacist

## 2013-10-18 ENCOUNTER — Telehealth: Payer: Self-pay | Admitting: Cardiovascular Disease

## 2013-10-18 NOTE — Telephone Encounter (Signed)
Left message for patient to call back  

## 2013-10-18 NOTE — Telephone Encounter (Signed)
Patient called to let me know that Dr. Lia Foyer has called him and is going to try to get him into [facility].  Patient had a h/o rhabdomyolysis on fibrate.  Has a LDL > 190 mg/dL.  No CAD.  Will hopefully have certain markers on blood work to get him enrolled.

## 2013-10-18 NOTE — Telephone Encounter (Signed)
New message     For [name] Another doctor want to put him on a medication---he want to make sure it is ok

## 2014-01-18 ENCOUNTER — Other Ambulatory Visit: Payer: Self-pay | Admitting: Cardiovascular Disease

## 2014-01-18 NOTE — Telephone Encounter (Signed)
Please advise on refill. It looks like patient is overdue for a lipid clinic appointment, and he canceled his last two appointments with you. Thanks, MI

## 2014-05-22 ENCOUNTER — Other Ambulatory Visit: Payer: Self-pay | Admitting: Cardiovascular Disease

## 2014-05-23 ENCOUNTER — Other Ambulatory Visit: Payer: Self-pay

## 2014-05-23 DIAGNOSIS — I10 Essential (primary) hypertension: Secondary | ICD-10-CM

## 2014-05-23 MED ORDER — AMLODIPINE BESYLATE 5 MG PO TABS: 5.0000 mg | ORAL_TABLET | Freq: Every day | ORAL | Status: DC

## 2014-05-24 ENCOUNTER — Telehealth: Payer: Self-pay | Admitting: Cardiovascular Disease

## 2014-05-24 NOTE — Telephone Encounter (Signed)
Patient calling with recent onset of hypotension/syncopal episodes that started yesterday, 3/17.  Patient also has a mild, throbbing headache in right frontal lobe. Dizziness x1 yesterday relieved after lying down and elevating legs and resting. Dizziness/syncope today x2 but he remains a "bit woozy afterwards". Denies dehydration or concentrated UOP. BP today as follows:  12:00 pm = 110/71, 108/79, 127/84; 1:00 pm = 88/43, 88/59; 1:20 pm = 134/84.  HR today between 93-112 regular and strong. Patient denies dyspnea, edema, CP, weakness, visual disturbances, fever. States he started a new medication, Neurontin, on Wednesday, 3/16, [facility]. Advised patient that his syncopal episodes, dizziness is a potential side effect of Neurontin. Advised patient to contact his MD at the [facility] to report his current symptoms/BP. [facility] will call him back within a couple of hours to follow up and determine his current status and re-assess his condition.  Contacted patient at 2:10 pm. Patient did call [facility] and Dr. Luan Pulling advised patient to stop Neurontin. [facility] nurse had patient recheck his BP 122/74, HR 71. Patient stated he felt better about the plan now that Neurontin was stopped.  Advised him to continue to monitor his BP at least twice daily throughout the weekend and to call our MD on call should he experience low BP or any other untoward symptoms, side effects, concerns. He verbalized understanding and agreement with plan of care. Dr. Burt Knack will be made aware of today's events.

## 2014-05-24 NOTE — Telephone Encounter (Signed)
Pt c/o Syncope: STAT if syncope occurred within 30 minutes and pt complains of lightheadedness High Priority if episode of passing out, completely, today or in last 24 hours   1. Did you pass out today? Did not pass out but nearly did so he sat down  2. When is the last time you passed out? 5 minutes ago  3. Has this occurred multiple times? Multiple times since yesterday  4. Did you have any symptoms prior to passing out? Lightheaded, dizzy, bad headache, started feeling weak

## 2014-05-26 NOTE — Telephone Encounter (Signed)
Noted. Sounds related to neurontin

## 2014-07-18 ENCOUNTER — Other Ambulatory Visit (HOSPITAL_COMMUNITY): Payer: Self-pay | Admitting: Anesthesiology

## 2014-07-18 DIAGNOSIS — M545 Low back pain: Secondary | ICD-10-CM

## 2014-07-19 ENCOUNTER — Ambulatory Visit (HOSPITAL_COMMUNITY)
Admission: RE | Admit: 2014-07-19 | Discharge: 2014-07-19 | Disposition: A | Discharge status: Home / Self Care | Payer: 59 | Source: Ambulatory Visit | Attending: Anesthesiology | Admitting: Anesthesiology

## 2014-07-19 DIAGNOSIS — G8929 Other chronic pain: Secondary | ICD-10-CM | POA: Diagnosis not present

## 2014-07-19 DIAGNOSIS — M545 Low back pain: Secondary | ICD-10-CM | POA: Diagnosis present

## 2014-07-19 DIAGNOSIS — R200 Anesthesia of skin: Secondary | ICD-10-CM | POA: Insufficient documentation

## 2014-09-03 ENCOUNTER — Other Ambulatory Visit: Payer: Self-pay | Admitting: Cardiovascular Disease

## 2015-05-13 DIAGNOSIS — H5201 Hypermetropia, right eye: Secondary | ICD-10-CM | POA: Diagnosis not present

## 2015-05-20 DIAGNOSIS — M5416 Radiculopathy, lumbar region: Secondary | ICD-10-CM | POA: Diagnosis not present

## 2015-05-20 DIAGNOSIS — G5701 Lesion of sciatic nerve, right lower limb: Secondary | ICD-10-CM | POA: Diagnosis not present

## 2015-05-20 MED FILL — BUPROPION SR 150 MG TABLET: 150 | 90 days supply | Qty: 90 | Fill #2

## 2015-05-20 MED FILL — LABETALOL HCL 200 MG TABLET: 200 | 90 days supply | Qty: 180 | Fill #2

## 2015-05-20 MED FILL — PANTOPRAZOLE SOD DR 40 MG T: 40 | 90 days supply | Qty: 90 | Fill #2

## 2015-05-20 MED FILL — AMLODIPINE BESYLATE 5 MG TA: 5 | 90 days supply | Qty: 90 | Fill #2

## 2015-05-20 MED FILL — METHOCARBAMOL 750 MG TABLET: 750 | 90 days supply | Qty: 360 | Fill #0

## 2015-05-20 MED FILL — EZETIMIBE 10 MG TABLET: 10 | 90 days supply | Qty: 90 | Fill #3

## 2015-07-28 DIAGNOSIS — R05 Cough: Secondary | ICD-10-CM | POA: Diagnosis not present

## 2015-07-28 DIAGNOSIS — R509 Fever, unspecified: Secondary | ICD-10-CM | POA: Diagnosis not present

## 2015-07-28 DIAGNOSIS — Z6824 Body mass index (BMI) 24.0-24.9, adult: Secondary | ICD-10-CM | POA: Diagnosis not present

## 2015-07-28 DIAGNOSIS — J209 Acute bronchitis, unspecified: Secondary | ICD-10-CM | POA: Diagnosis not present

## 2015-07-28 DIAGNOSIS — J019 Acute sinusitis, unspecified: Secondary | ICD-10-CM | POA: Diagnosis not present

## 2015-07-28 MED FILL — PROMETHAZINE-CODEINE SYRUP: 6.25-10 | 8 days supply | Qty: 150 | Fill #0

## 2015-07-28 MED FILL — predniSONE 5 MG TABS: 5 | 6 days supply | Qty: 21 | Fill #0

## 2015-07-28 MED FILL — levoFLOXacin 500 MG TABS: 500 | 7 days supply | Qty: 7 | Fill #0

## 2015-08-21 MED FILL — PANTOPRAZOLE SOD DR 40 MG T: 40 | 90 days supply | Qty: 90 | Fill #3

## 2015-08-21 MED FILL — AMLODIPINE BESYLATE 5 MG TA: 5 | 90 days supply | Qty: 90 | Fill #3

## 2015-08-21 MED FILL — EZETIMIBE 10 MG TABLET: 10 | 90 days supply | Qty: 90 | Fill #4

## 2015-08-21 MED FILL — BUPROPION SR 150 MG TABLET: 150 | 90 days supply | Qty: 90 | Fill #3

## 2015-08-21 MED FILL — LABETALOL HCL 200 MG TABLET: 200 | 90 days supply | Qty: 180 | Fill #3

## 2015-08-25 MED FILL — BELSOMRA 10 MG TABLET: 10 | 30 days supply | Qty: 30 | Fill #0

## 2015-08-26 DIAGNOSIS — M5416 Radiculopathy, lumbar region: Secondary | ICD-10-CM | POA: Diagnosis not present

## 2015-08-26 DIAGNOSIS — Z6825 Body mass index (BMI) 25.0-25.9, adult: Secondary | ICD-10-CM | POA: Diagnosis not present

## 2015-08-26 DIAGNOSIS — G5701 Lesion of sciatic nerve, right lower limb: Secondary | ICD-10-CM | POA: Diagnosis not present

## 2015-08-26 DIAGNOSIS — I10 Essential (primary) hypertension: Secondary | ICD-10-CM | POA: Diagnosis not present

## 2015-08-26 MED FILL — METHOCARBAMOL 750 MG TABLET: 750 | 30 days supply | Qty: 120 | Fill #1

## 2015-09-25 MED FILL — METHOCARBAMOL 750 MG TABLET: 750 | 90 days supply | Qty: 360 | Fill #2

## 2015-11-19 MED FILL — BUPROPION SR 150 MG TABLET: 150 | 90 days supply | Qty: 90 | Fill #0

## 2015-11-19 MED FILL — PANTOPRAZOLE SOD DR 40 MG T: 40 | 90 days supply | Qty: 90 | Fill #0

## 2015-11-19 MED FILL — EZETIMIBE 10 MG TABLET: 10 | 90 days supply | Qty: 90 | Fill #0

## 2015-11-19 MED FILL — LABETALOL HCL 200 MG TABLET: 200 | 90 days supply | Qty: 180 | Fill #0

## 2015-11-19 MED FILL — AMLODIPINE BESYLATE 5 MG TA: 5 | 90 days supply | Qty: 90 | Fill #0

## 2015-11-20 DIAGNOSIS — G5701 Lesion of sciatic nerve, right lower limb: Secondary | ICD-10-CM | POA: Diagnosis not present

## 2015-11-20 DIAGNOSIS — Z6825 Body mass index (BMI) 25.0-25.9, adult: Secondary | ICD-10-CM | POA: Diagnosis not present

## 2015-11-20 DIAGNOSIS — M5416 Radiculopathy, lumbar region: Secondary | ICD-10-CM | POA: Diagnosis not present

## 2015-12-05 DIAGNOSIS — Z125 Encounter for screening for malignant neoplasm of prostate: Secondary | ICD-10-CM | POA: Diagnosis not present

## 2015-12-05 DIAGNOSIS — E784 Other hyperlipidemia: Secondary | ICD-10-CM | POA: Diagnosis not present

## 2015-12-05 DIAGNOSIS — Z0000 Encounter for general adult medical examination without abnormal findings: Secondary | ICD-10-CM | POA: Diagnosis not present

## 2015-12-05 MED FILL — OXYCODONE/APAP 5-325: 5-325 | 23 days supply | Qty: 90 | Fill #0

## 2015-12-09 DIAGNOSIS — K5909 Other constipation: Secondary | ICD-10-CM | POA: Diagnosis not present

## 2015-12-09 DIAGNOSIS — J3089 Other allergic rhinitis: Secondary | ICD-10-CM | POA: Diagnosis not present

## 2015-12-09 DIAGNOSIS — E784 Other hyperlipidemia: Secondary | ICD-10-CM | POA: Diagnosis not present

## 2015-12-09 DIAGNOSIS — M1990 Unspecified osteoarthritis, unspecified site: Secondary | ICD-10-CM | POA: Diagnosis not present

## 2015-12-09 DIAGNOSIS — Z0000 Encounter for general adult medical examination without abnormal findings: Secondary | ICD-10-CM | POA: Diagnosis not present

## 2015-12-09 DIAGNOSIS — Z23 Encounter for immunization: Secondary | ICD-10-CM | POA: Diagnosis not present

## 2015-12-09 DIAGNOSIS — I10 Essential (primary) hypertension: Secondary | ICD-10-CM | POA: Diagnosis not present

## 2015-12-09 DIAGNOSIS — F329 Major depressive disorder, single episode, unspecified: Secondary | ICD-10-CM | POA: Diagnosis not present

## 2015-12-09 DIAGNOSIS — N200 Calculus of kidney: Secondary | ICD-10-CM | POA: Diagnosis not present

## 2015-12-09 DIAGNOSIS — M5489 Other dorsalgia: Secondary | ICD-10-CM | POA: Diagnosis not present

## 2015-12-10 ENCOUNTER — Other Ambulatory Visit (HOSPITAL_COMMUNITY): Payer: Self-pay | Admitting: Internal Medicine

## 2015-12-10 DIAGNOSIS — N200 Calculus of kidney: Secondary | ICD-10-CM

## 2015-12-10 DIAGNOSIS — Z1212 Encounter for screening for malignant neoplasm of rectum: Secondary | ICD-10-CM | POA: Diagnosis not present

## 2015-12-13 ENCOUNTER — Ambulatory Visit (HOSPITAL_COMMUNITY)
Admission: RE | Admit: 2015-12-13 | Discharge: 2015-12-13 | Disposition: A | Discharge status: Home / Self Care | Payer: 59 | Source: Ambulatory Visit | Attending: Internal Medicine | Admitting: Internal Medicine

## 2015-12-13 DIAGNOSIS — N200 Calculus of kidney: Secondary | ICD-10-CM | POA: Diagnosis not present

## 2015-12-13 DIAGNOSIS — I700 Atherosclerosis of aorta: Secondary | ICD-10-CM | POA: Diagnosis not present

## 2015-12-16 ENCOUNTER — Ambulatory Visit (HOSPITAL_COMMUNITY): Payer: 59

## 2015-12-19 MED FILL — METHOCARBAMOL 750 MG TABLET: 750 | 30 days supply | Qty: 120 | Fill #3

## 2016-01-09 MED FILL — OXYCODONE W/APAP 5/325 TAB: 5-325 | 23 days supply | Qty: 90 | Fill #0

## 2016-02-11 MED FILL — OXYCODONE W/APAP 5/325 TAB: 5-325 | 23 days supply | Qty: 90 | Fill #0

## 2016-02-11 MED FILL — LABETALOL HCL 200 MG TABLET: 200 | 90 days supply | Qty: 180 | Fill #1

## 2016-02-11 MED FILL — METHOCARBAMOL 750 MG TABLET: 750 | 90 days supply | Qty: 360 | Fill #4

## 2016-02-11 MED FILL — BUPROPION SR 150 MG TABLET: 150 | 90 days supply | Qty: 90 | Fill #1

## 2016-02-11 MED FILL — PANTOPRAZOLE SOD DR 40 MG T: 40 | 90 days supply | Qty: 90 | Fill #1

## 2016-02-11 MED FILL — EZETIMIBE 10 MG TABLET: 10 | 90 days supply | Qty: 90 | Fill #1

## 2016-02-11 MED FILL — AMLODIPINE BESYLATE 5 MG TA: 5 | 90 days supply | Qty: 90 | Fill #1

## 2016-02-18 DIAGNOSIS — G5701 Lesion of sciatic nerve, right lower limb: Secondary | ICD-10-CM | POA: Diagnosis not present

## 2016-02-18 DIAGNOSIS — I10 Essential (primary) hypertension: Secondary | ICD-10-CM | POA: Diagnosis not present

## 2016-02-18 DIAGNOSIS — M5416 Radiculopathy, lumbar region: Secondary | ICD-10-CM | POA: Diagnosis not present

## 2016-02-18 DIAGNOSIS — Z6825 Body mass index (BMI) 25.0-25.9, adult: Secondary | ICD-10-CM | POA: Diagnosis not present

## 2016-05-01 ENCOUNTER — Telehealth: Payer: 59 | Admitting: Nurse Practitioner

## 2016-05-01 DIAGNOSIS — J0100 Acute maxillary sinusitis, unspecified: Secondary | ICD-10-CM

## 2016-05-01 MED ORDER — AMOXICILLIN-POT CLAVULANATE 875-125 MG PO TABS: 1.0000 | ORAL_TABLET | Freq: Two times a day (BID) | ORAL | 0 refills | Status: DC

## 2016-05-01 NOTE — Progress Notes (Signed)

## 2016-05-10 MED FILL — AMLODIPINE BESYLATE 5 MG TA: 5 | 90 days supply | Qty: 90 | Fill #2

## 2016-05-10 MED FILL — BUPROPION SR 150 MG TABLET: 150 | 90 days supply | Qty: 90 | Fill #2

## 2016-05-10 MED FILL — PANTOPRAZOLE SOD DR 40 MG T: 40 | 90 days supply | Qty: 90 | Fill #2

## 2016-05-10 MED FILL — EZETIMIBE 10 MG TABLET: 10 | 90 days supply | Qty: 90 | Fill #2

## 2016-05-10 MED FILL — LABETALOL HCL 200 MG TABLET: 200 | 90 days supply | Qty: 180 | Fill #2

## 2016-05-11 DIAGNOSIS — M5416 Radiculopathy, lumbar region: Secondary | ICD-10-CM | POA: Diagnosis not present

## 2016-05-11 DIAGNOSIS — Z6825 Body mass index (BMI) 25.0-25.9, adult: Secondary | ICD-10-CM | POA: Diagnosis not present

## 2016-05-11 DIAGNOSIS — I10 Essential (primary) hypertension: Secondary | ICD-10-CM | POA: Diagnosis not present

## 2016-05-11 MED FILL — METHOCARBAMOL 750 MG TABLET: 750 | 90 days supply | Qty: 360 | Fill #0

## 2016-05-12 MED FILL — OXYCODONE W/APAP 5/325 TAB: 5-325 | 23 days supply | Qty: 90 | Fill #0

## 2016-06-15 DIAGNOSIS — Z6825 Body mass index (BMI) 25.0-25.9, adult: Secondary | ICD-10-CM | POA: Diagnosis not present

## 2016-06-15 DIAGNOSIS — I700 Atherosclerosis of aorta: Secondary | ICD-10-CM | POA: Diagnosis not present

## 2016-06-15 DIAGNOSIS — I10 Essential (primary) hypertension: Secondary | ICD-10-CM | POA: Diagnosis not present

## 2016-06-15 DIAGNOSIS — E784 Other hyperlipidemia: Secondary | ICD-10-CM | POA: Diagnosis not present

## 2016-06-15 DIAGNOSIS — D126 Benign neoplasm of colon, unspecified: Secondary | ICD-10-CM | POA: Diagnosis not present

## 2016-06-15 MED FILL — OXYCODONE W/APAP 5/325 TAB: 5-325 | 23 days supply | Qty: 90 | Fill #0

## 2016-06-23 DIAGNOSIS — I10 Essential (primary) hypertension: Secondary | ICD-10-CM | POA: Diagnosis not present

## 2016-07-01 DIAGNOSIS — I10 Essential (primary) hypertension: Secondary | ICD-10-CM | POA: Diagnosis not present

## 2016-07-08 MED FILL — PRALUENT 150 MG/ML PEN: 150 | 28 days supply | Qty: 2 | Fill #0

## 2016-07-13 DIAGNOSIS — H5211 Myopia, right eye: Secondary | ICD-10-CM | POA: Diagnosis not present

## 2016-07-13 DIAGNOSIS — H04123 Dry eye syndrome of bilateral lacrimal glands: Secondary | ICD-10-CM | POA: Diagnosis not present

## 2016-07-13 DIAGNOSIS — H40059 Ocular hypertension, unspecified eye: Secondary | ICD-10-CM | POA: Diagnosis not present

## 2016-07-14 DIAGNOSIS — Z5181 Encounter for therapeutic drug level monitoring: Secondary | ICD-10-CM | POA: Diagnosis not present

## 2016-07-14 DIAGNOSIS — M5416 Radiculopathy, lumbar region: Secondary | ICD-10-CM | POA: Diagnosis not present

## 2016-07-14 DIAGNOSIS — I10 Essential (primary) hypertension: Secondary | ICD-10-CM | POA: Diagnosis not present

## 2016-07-14 DIAGNOSIS — Z6825 Body mass index (BMI) 25.0-25.9, adult: Secondary | ICD-10-CM | POA: Diagnosis not present

## 2016-07-14 DIAGNOSIS — Z79899 Other long term (current) drug therapy: Secondary | ICD-10-CM | POA: Diagnosis not present

## 2016-07-14 DIAGNOSIS — Z79891 Long term (current) use of opiate analgesic: Secondary | ICD-10-CM | POA: Diagnosis not present

## 2016-07-14 MED FILL — OXYCODONE W/APAP 5/325 TAB: 5-325 | 23 days supply | Qty: 90 | Fill #0

## 2016-08-04 MED FILL — EZETIMIBE 10 MG TAB: 10 | 90 days supply | Qty: 90 | Fill #3

## 2016-08-04 MED FILL — PANTOPRAZOLE SOD DR 40 MG T: 40 | 90 days supply | Qty: 90 | Fill #3

## 2016-08-04 MED FILL — METHOCARBAMOL 750 MG TABLET: 750 | 90 days supply | Qty: 360 | Fill #1

## 2016-08-04 MED FILL — PRALUENT 150 MG/ML PEN: 150 | 28 days supply | Qty: 2 | Fill #1

## 2016-08-04 MED FILL — AMLODIPINE BESYLATE 5 MG TA: 5 | 90 days supply | Qty: 90 | Fill #3

## 2016-08-04 MED FILL — BUPROPION SR 150 MG TABLET: 150 | 90 days supply | Qty: 90 | Fill #3

## 2016-08-04 MED FILL — LABETALOL HCL 200 MG TABLET: 200 | 90 days supply | Qty: 180 | Fill #3

## 2016-08-05 MED FILL — ZOLPIDEM TARTRATE 5 MG TAB: 5 | 30 days supply | Qty: 30 | Fill #0

## 2016-08-24 ENCOUNTER — Encounter: Payer: Self-pay | Admitting: Gastroenterology

## 2016-09-03 ENCOUNTER — Telehealth: Payer: Self-pay | Admitting: Gastroenterology

## 2016-09-03 MED FILL — PRALUENT 150 MG/ML PEN: 150 | 28 days supply | Qty: 2 | Fill #2

## 2016-09-03 NOTE — Telephone Encounter (Signed)
Dr Ardis Hughs the pt would like to set up his recall colonoscopy.  Is this suppose to be at [facility]?

## 2016-09-03 NOTE — Telephone Encounter (Signed)
2015 colonoscopy was in [place].  I'm happy to do this one for him [facility] or [facility], whichever he prefers.  If at [facility] then should be next available MAC Thursday not during hosp week.  If at [facility] next available.  For personal history of adenomatous polyps.    Thanks

## 2016-09-06 NOTE — Telephone Encounter (Signed)
Left message on machine to call back  

## 2016-09-06 NOTE — Telephone Encounter (Signed)
The pt has been scheduled for pre visit and colon.  He is aware and will call with any questions

## 2016-09-30 MED FILL — ZOLPIDEM TARTRATE 5 MG TAB: 5 | 30 days supply | Qty: 30 | Fill #1

## 2016-10-12 DIAGNOSIS — M5416 Radiculopathy, lumbar region: Secondary | ICD-10-CM | POA: Diagnosis not present

## 2016-10-12 DIAGNOSIS — Z6825 Body mass index (BMI) 25.0-25.9, adult: Secondary | ICD-10-CM | POA: Diagnosis not present

## 2016-10-12 DIAGNOSIS — I10 Essential (primary) hypertension: Secondary | ICD-10-CM | POA: Diagnosis not present

## 2016-10-12 DIAGNOSIS — G5701 Lesion of sciatic nerve, right lower limb: Secondary | ICD-10-CM | POA: Diagnosis not present

## 2016-10-12 MED FILL — PRALUENT 150 MG/ML PEN: 150 | 28 days supply | Qty: 2 | Fill #3

## 2016-10-14 MED FILL — OXYCODONE W/APAP 5/325 TAB: 5-325 | 23 days supply | Qty: 90 | Fill #0

## 2016-11-01 MED FILL — AMLODIPINE BESYLATE 5 MG TA: 5 | 90 days supply | Qty: 90 | Fill #4

## 2016-11-01 MED FILL — LABETALOL HCL 200 MG TABLET: 200 | 90 days supply | Qty: 180 | Fill #4

## 2016-11-01 MED FILL — METHOCARBAMOL 750 MG TABLET: 750 | 17 days supply | Qty: 71 | Fill #2

## 2016-11-01 MED FILL — EZETIMIBE 10 MG TAB: 10 | 90 days supply | Qty: 90 | Fill #4

## 2016-11-01 MED FILL — BUPROPION SR 150 MG TABLET: 150 | 90 days supply | Qty: 90 | Fill #4

## 2016-11-01 MED FILL — ZOLPIDEM TARTRATE 5 MG TAB: 5 | 30 days supply | Qty: 30 | Fill #2

## 2016-11-01 MED FILL — PANTOPRAZOLE SOD DR 40 MG T: 40 | 90 days supply | Qty: 90 | Fill #4

## 2016-11-06 HISTORY — PX: COLONOSCOPY: SHX174

## 2016-11-09 MED FILL — PRALUENT 150 MG/ML PEN: 150 | 28 days supply | Qty: 2 | Fill #4

## 2016-11-10 ENCOUNTER — Ambulatory Visit (AMBULATORY_SURGERY_CENTER): Payer: Self-pay | Admitting: *Deleted

## 2016-11-10 ENCOUNTER — Encounter: Payer: Self-pay | Admitting: Gastroenterology

## 2016-11-10 VITALS — Ht 70.5 in | Wt 183.0 lb

## 2016-11-10 DIAGNOSIS — Z86010 Personal history of colonic polyps: Secondary | ICD-10-CM

## 2016-11-10 MED ORDER — NA SULFATE-K SULFATE-MG SULF 17.5-3.13-1.6 GM/177ML PO SOLN: ORAL | 0 refills | Status: DC

## 2016-11-10 MED FILL — SUPREP BOWEL PREP KIT: 17.5-3.13-1 | 1 days supply | Qty: 354 | Fill #0

## 2016-11-10 NOTE — Progress Notes (Addendum)
Patient denies any allergies to eggs or soy. Patient denies any problems with anesthesia/sedation. Patient denies any oxygen use at home and does not take any diet/weight loss medications. EMMI education assisgned to patient on colonoscopy, this was explained and instructions given to patient. Patient explained that he was severely dehydrated before and after colonoscopy and had to go to ER,  encouraged patient to drink plenty of fluids not just propelled water like he did last colon. Explained he should be drinking multiple types of fluids during the day before beginning the [place]. He verbalizes understanding. Patient denies being on a research drug.

## 2016-11-16 MED FILL — METHOCARBAMOL 500 MG TABLET: 500 | 30 days supply | Qty: 180 | Fill #0

## 2016-11-17 ENCOUNTER — Encounter: Payer: Self-pay | Admitting: Gastroenterology

## 2016-11-17 ENCOUNTER — Ambulatory Visit (AMBULATORY_SURGERY_CENTER): Payer: 59 | Admitting: Gastroenterology

## 2016-11-17 VITALS — BP 118/82 | HR 62 | Temp 98.4°F | Resp 19 | Ht 70.5 in | Wt 185.0 lb

## 2016-11-17 DIAGNOSIS — D124 Benign neoplasm of descending colon: Secondary | ICD-10-CM

## 2016-11-17 DIAGNOSIS — Z86010 Personal history of colonic polyps: Secondary | ICD-10-CM | POA: Diagnosis present

## 2016-11-17 DIAGNOSIS — D123 Benign neoplasm of transverse colon: Secondary | ICD-10-CM | POA: Diagnosis not present

## 2016-11-17 DIAGNOSIS — I10 Essential (primary) hypertension: Secondary | ICD-10-CM | POA: Diagnosis not present

## 2016-11-17 MED ORDER — SODIUM CHLORIDE 0.9 % IV SOLN: 500.0000 mL | INTRAVENOUS | Status: DC

## 2016-11-17 NOTE — Op Note (Signed)
[facility] Patient Name: [name] Procedure Date: 11/17/2016 7:58 AM MRN: [id number] Endoscopist: Milus Banister , MD Age: 51 Referring MD:  Date of Birth: May 31, 1965 Gender: Male Account #: [id number] Procedure:                Colonoscopy Indications:              High risk colon cancer surveillance: Personal                            history of colonic polyps; 2009 colonoscopy 1 TA,                            2013 colonoscopy 3 TAs (all subCM) Medicines:                Monitored Anesthesia Care Procedure:                Pre-Anesthesia Assessment:                           - Prior to the procedure, a History and Physical                            was performed, and patient medications and                            allergies were reviewed. The patient's tolerance of                            previous anesthesia was also reviewed. The risks                            and benefits of the procedure and the sedation                            options and risks were discussed with the patient.                            All questions were answered, and informed consent                            was obtained. Prior Anticoagulants: The patient has                            taken no previous anticoagulant or antiplatelet                            agents. ASA Grade Assessment: II - A patient with                            mild systemic disease. After reviewing the risks                            and benefits, the patient was deemed in  satisfactory condition to undergo the procedure.                           After obtaining informed consent, the colonoscope                            was passed under direct vision. Throughout the                            procedure, the patient's blood pressure, pulse, and                            oxygen saturations were monitored continuously. The                            Colonoscope was introduced  through the anus and                            advanced to the the cecum, identified by                            appendiceal orifice and ileocecal valve. The                            colonoscopy was performed without difficulty. The                            patient tolerated the procedure well. The quality                            of the bowel preparation was good. The ileocecal                            valve, appendiceal orifice, and rectum were                            photographed. Scope In: 8:36:22 AM Scope Out: 8:49:39 AM Scope Withdrawal Time: 0 hours 10 minutes 56 seconds  Total Procedure Duration: 0 hours 13 minutes 17 seconds  Findings:                 Seven sessile polyps were found in the descending                            colon and transverse colon. The polyps were 2 to 8                            mm in size. These polyps were removed with a cold                            snare. Resection and retrieval were complete.                           The exam was otherwise without abnormality on  direct and retroflexion views. Complications:            No immediate complications. Estimated blood loss:                            None. Estimated Blood Loss:     Estimated blood loss: none. Impression:               - Seven 2 to 8 mm polyps in the descending colon                            and in the transverse colon, removed with a cold                            snare. Resected and retrieved.                           - The examination was otherwise normal on direct                            and retroflexion views. Recommendation:           - Patient has a contact number available for                            emergencies. The signs and symptoms of potential                            delayed complications were discussed with the                            patient. Return to normal activities tomorrow.                            Written  discharge instructions were provided to the                            patient.                           - Resume previous diet.                           - Continue present medications.                           You will receive a letter within 2-3 weeks with the                            pathology results and my final recommendations.                           If the polyp(s) is proven to be 'pre-cancerous' on                            pathology, you will need repeat colonoscopy in 3-5  years. Milus Banister, MD 11/17/2016 8:52:59 AM This report has been signed electronically.

## 2016-11-17 NOTE — Progress Notes (Signed)
Called to room to assist during endoscopic procedure.  Patient ID and intended procedure confirmed with present staff. Received instructions for my participation in the procedure from the performing physician.  

## 2016-11-17 NOTE — Patient Instructions (Signed)
YOU HAD AN ENDOSCOPIC PROCEDURE TODAY AT [facility]:   Refer to the procedure report that was given to you for any specific questions about what was found during the examination.  If the procedure report does not answer your questions, please call your gastroenterologist to clarify.  If you requested that your care partner not be given the details of your procedure findings, then the procedure report has been included in a sealed envelope for you to review at your convenience later.  YOU SHOULD EXPECT: Some feelings of bloating in the abdomen. Passage of more gas than usual.  Walking can help get rid of the air that was put into your GI tract during the procedure and reduce the bloating. If you had a lower endoscopy (such as a colonoscopy or flexible sigmoidoscopy) you may notice spotting of blood in your stool or on the toilet paper. If you underwent a bowel prep for your procedure, you may not have a normal bowel movement for a few days.  Please Note:  You might notice some irritation and congestion in your nose or some drainage.  This is from the oxygen used during your procedure.  There is no need for concern and it should clear up in a day or so.  SYMPTOMS TO REPORT IMMEDIATELY:   Following lower endoscopy (colonoscopy or flexible sigmoidoscopy):  Excessive amounts of blood in the stool  Significant tenderness or worsening of abdominal pains  Swelling of the abdomen that is new, acute  Fever of 100F or higher  For urgent or emergent issues, a gastroenterologist can be reached at any hour by calling [phone].   DIET:  We do recommend a small meal at first, but then you may proceed to your regular diet.  Drink plenty of fluids but you should avoid alcoholic beverages for 24 hours.  ACTIVITY:  You should plan to take it easy for the rest of today and you should NOT DRIVE or use heavy machinery until tomorrow (because of the sedation medicines used during the test).     FOLLOW UP: Our staff will call the number listed on your records the next business day following your procedure to check on you and address any questions or concerns that you may have regarding the information given to you following your procedure. If we do not reach you, we will leave a message.  However, if you are feeling well and you are not experiencing any problems, there is no need to return our call.  We will assume that you have returned to your regular daily activities without incident.  If any biopsies were taken you will be contacted by phone or by letter within the next 1-3 weeks.  Please call us at [phone] if you have not heard about the biopsies in 3 weeks.   Polyps (handout given) Await for biopsy results to determine next repeat Colonoscopy screening   SIGNATURES/CONFIDENTIALITY: You and/or your care partner have signed paperwork which will be entered into your electronic medical record.  These signatures attest to the fact that that the information above on your After Visit Summary has been reviewed and is understood.  Full responsibility of the confidentiality of this discharge information lies with you and/or your care-partner.

## 2016-11-17 NOTE — Progress Notes (Signed)
Report given to PACU, vss 

## 2016-11-18 ENCOUNTER — Telehealth: Payer: Self-pay

## 2016-11-18 NOTE — Telephone Encounter (Signed)
  Follow up Call-  Call [name] number 11/17/2016  Post procedure Call [name] phone  # [phone]  Permission to leave phone message Yes  Some recent data might be hidden     Patient questions:  Do you have a fever, pain , or abdominal swelling? No. Pain Score  0 *  Have you tolerated food without any problems? Yes.    Have you been able to return to your normal activities? Yes.    Do you have any questions about your discharge instructions: Diet   No. Medications  No. Follow up visit  No.  Do you have questions or concerns about your Care? No.  Actions: * If pain score is 4 or above: No action needed, pain <4.

## 2016-11-23 ENCOUNTER — Encounter: Payer: Self-pay | Admitting: Gastroenterology

## 2016-12-06 MED FILL — PRALUENT 150 MG/ML PEN: 150 | 28 days supply | Qty: 2 | Fill #5

## 2016-12-06 MED FILL — ZOLPIDEM TARTRATE 5 MG TAB: 5 | 30 days supply | Qty: 30 | Fill #3

## 2016-12-15 MED FILL — METHOCARBAMOL 500 MG TABS: 500 | 30 days supply | Qty: 180 | Fill #1

## 2016-12-17 MED FILL — OXYCODONE W/APAP 5/325 TAB: 5-325 | 23 days supply | Qty: 90 | Fill #0

## 2016-12-31 MED FILL — PRALUENT 150 MG/ML PEN: 150 | 28 days supply | Qty: 2 | Fill #6

## 2017-01-11 DIAGNOSIS — G5701 Lesion of sciatic nerve, right lower limb: Secondary | ICD-10-CM | POA: Diagnosis not present

## 2017-01-11 DIAGNOSIS — Z6825 Body mass index (BMI) 25.0-25.9, adult: Secondary | ICD-10-CM | POA: Diagnosis not present

## 2017-01-11 DIAGNOSIS — M5416 Radiculopathy, lumbar region: Secondary | ICD-10-CM | POA: Diagnosis not present

## 2017-01-11 DIAGNOSIS — I10 Essential (primary) hypertension: Secondary | ICD-10-CM | POA: Diagnosis not present

## 2017-01-11 MED FILL — METHOCARBAMOL 500 MG TABS: 500 | 30 days supply | Qty: 180 | Fill #0

## 2017-01-21 DIAGNOSIS — Z0000 Encounter for general adult medical examination without abnormal findings: Secondary | ICD-10-CM | POA: Diagnosis not present

## 2017-01-21 DIAGNOSIS — Z125 Encounter for screening for malignant neoplasm of prostate: Secondary | ICD-10-CM | POA: Diagnosis not present

## 2017-01-25 DIAGNOSIS — M19042 Primary osteoarthritis, left hand: Secondary | ICD-10-CM | POA: Diagnosis not present

## 2017-01-25 DIAGNOSIS — M19041 Primary osteoarthritis, right hand: Secondary | ICD-10-CM | POA: Diagnosis not present

## 2017-01-25 DIAGNOSIS — I700 Atherosclerosis of aorta: Secondary | ICD-10-CM | POA: Diagnosis not present

## 2017-01-25 DIAGNOSIS — F3289 Other specified depressive episodes: Secondary | ICD-10-CM | POA: Diagnosis not present

## 2017-01-25 DIAGNOSIS — Z1389 Encounter for screening for other disorder: Secondary | ICD-10-CM | POA: Diagnosis not present

## 2017-01-25 DIAGNOSIS — I10 Essential (primary) hypertension: Secondary | ICD-10-CM | POA: Diagnosis not present

## 2017-01-25 DIAGNOSIS — R7301 Impaired fasting glucose: Secondary | ICD-10-CM | POA: Diagnosis not present

## 2017-01-25 DIAGNOSIS — J3089 Other allergic rhinitis: Secondary | ICD-10-CM | POA: Diagnosis not present

## 2017-01-25 DIAGNOSIS — Z0000 Encounter for general adult medical examination without abnormal findings: Secondary | ICD-10-CM | POA: Diagnosis not present

## 2017-01-25 DIAGNOSIS — E7849 Other hyperlipidemia: Secondary | ICD-10-CM | POA: Diagnosis not present

## 2017-01-25 DIAGNOSIS — E538 Deficiency of other specified B group vitamins: Secondary | ICD-10-CM | POA: Diagnosis not present

## 2017-01-25 MED FILL — BUPROPION SR 150 MG TABLET: 150 | 90 days supply | Qty: 90 | Fill #0 | Status: TO

## 2017-01-25 MED FILL — ZOLPIDEM TARTRATE 5 MG TAB: 5 | 30 days supply | Qty: 30 | Fill #0

## 2017-01-25 MED FILL — ERYTHROMYCIN 2% GEL: 2 | 30 days supply | Qty: 60 | Fill #0

## 2017-01-25 MED FILL — AMLODIPINE BESYLATE 5 MG TA: 5 | 90 days supply | Qty: 90 | Fill #0 | Status: TO

## 2017-01-25 MED FILL — EZETIMIBE 10 MG TABS: 10 | 90 days supply | Qty: 90 | Fill #0 | Status: TO

## 2017-01-25 MED FILL — PRALUENT 150 MG/ML PEN: 150 | 28 days supply | Qty: 2 | Fill #7

## 2017-01-25 MED FILL — PANTOPRAZOLE SOD DR 40 MG T: 40 | 90 days supply | Qty: 90 | Fill #0 | Status: TO

## 2017-01-25 MED FILL — SHINGRIX 50 MCG SUS: 50 | 1 days supply | Qty: 1 | Fill #0

## 2017-01-25 MED FILL — LABETALOL HCL 200 MG TABLET: 200 | 90 days supply | Qty: 180 | Fill #0 | Status: TO

## 2017-02-09 DIAGNOSIS — E538 Deficiency of other specified B group vitamins: Secondary | ICD-10-CM | POA: Diagnosis not present

## 2017-02-09 MED FILL — METHOCARBAMOL 500 MG TABS: 500 | 30 days supply | Qty: 180 | Fill #1

## 2017-02-21 MED FILL — PRALUENT 150 MG/ML PEN: 150 | 28 days supply | Qty: 2 | Fill #8 | Status: TO

## 2017-02-23 MED FILL — ZOLPIDEM TARTRATE 5 MG TAB: 5 | 30 days supply | Qty: 30 | Fill #1

## 2017-03-10 MED FILL — METHOCARBAMOL 500 MG TABS: 500 | 30 days supply | Qty: 180 | Fill #2

## 2017-03-15 DIAGNOSIS — E538 Deficiency of other specified B group vitamins: Secondary | ICD-10-CM | POA: Diagnosis not present

## 2017-03-15 MED FILL — PRALUENT 150 MG/ML PEN: 150 | 28 days supply | Qty: 2 | Fill #0

## 2017-03-15 MED FILL — SHIPPING COST: 1 days supply | Qty: 1 | Fill #0

## 2017-03-28 MED FILL — ZOLPIDEM TARTRATE 5 MG TAB: 5 | 30 days supply | Qty: 30 | Fill #2 | Status: TO

## 2017-03-29 MED FILL — SHINGRIX 50 MCG SUS: 50 | 1 days supply | Qty: 1 | Fill #1

## 2017-04-13 DIAGNOSIS — G5701 Lesion of sciatic nerve, right lower limb: Secondary | ICD-10-CM | POA: Diagnosis not present

## 2017-04-13 DIAGNOSIS — E538 Deficiency of other specified B group vitamins: Secondary | ICD-10-CM | POA: Diagnosis not present

## 2017-04-13 DIAGNOSIS — I10 Essential (primary) hypertension: Secondary | ICD-10-CM | POA: Diagnosis not present

## 2017-04-13 DIAGNOSIS — M5416 Radiculopathy, lumbar region: Secondary | ICD-10-CM | POA: Diagnosis not present

## 2017-04-13 DIAGNOSIS — Z6826 Body mass index (BMI) 26.0-26.9, adult: Secondary | ICD-10-CM | POA: Diagnosis not present

## 2017-04-13 MED FILL — METHOCARBAMOL 500 MG TABS: 500 | 90 days supply | Qty: 540 | Fill #0

## 2017-04-13 MED FILL — SHIPPING COST: 1 days supply | Qty: 1 | Fill #1

## 2017-04-13 MED FILL — PRALUENT 150 MG/ML PEN: 150 | 28 days supply | Qty: 2 | Fill #1

## 2017-04-14 MED FILL — SHIPPING COST: 1 days supply | Qty: 1 | Fill #2

## 2017-05-06 MED FILL — ZOLPIDEM TARTRATE 5 MG TABL: 5 | 30 days supply | Qty: 30 | Fill #0

## 2017-05-06 MED FILL — SHIPPING COST: 1 days supply | Qty: 1 | Fill #3

## 2017-05-17 DIAGNOSIS — E538 Deficiency of other specified B group vitamins: Secondary | ICD-10-CM | POA: Diagnosis not present

## 2017-05-19 MED FILL — SHIPPING COST: 1 days supply | Qty: 1 | Fill #4

## 2017-05-19 MED FILL — AMLODIPINE BESYLATE 5 MG TA: 5 | 90 days supply | Qty: 90 | Fill #0

## 2017-05-19 MED FILL — BUPROPION SR 150 MG TABLET: 150 | 90 days supply | Qty: 90 | Fill #0

## 2017-05-19 MED FILL — PRALUENT 150 MG/ML PEN: 150 | 28 days supply | Qty: 2 | Fill #2

## 2017-05-19 MED FILL — EZETIMIBE 10 MG TABS: 10 | 90 days supply | Qty: 90 | Fill #0

## 2017-05-19 MED FILL — LABETALOL HCL 200 MG TABS: 200 | 90 days supply | Qty: 180 | Fill #0

## 2017-05-20 MED FILL — PANTOPRAZOLE SOD DR 40 MG T: 40 | 90 days supply | Qty: 90 | Fill #0

## 2017-06-07 MED FILL — ZOLPIDEM TARTRATE 5 MG TABL: 5 | 30 days supply | Qty: 30 | Fill #1

## 2017-06-07 MED FILL — SHIPPING COST: 1 days supply | Qty: 1 | Fill #5

## 2017-06-15 DIAGNOSIS — E538 Deficiency of other specified B group vitamins: Secondary | ICD-10-CM | POA: Diagnosis not present

## 2017-06-29 MED FILL — PRALUENT 150 MG/ML PEN: 150 | 28 days supply | Qty: 2 | Fill #0

## 2017-07-07 MED FILL — SHIPPING COST: 1 days supply | Qty: 1 | Fill #6

## 2017-07-07 MED FILL — ZOLPIDEM TARTRATE 5 MG TAB: 5 | 30 days supply | Qty: 30 | Fill #2

## 2017-07-08 MED FILL — METHOCARBAMOL 500 MG TABS: 500 | 90 days supply | Qty: 540 | Fill #1

## 2017-07-11 MED FILL — SHIPPING COST: 1 days supply | Qty: 1 | Fill #7

## 2017-07-12 DIAGNOSIS — M5416 Radiculopathy, lumbar region: Secondary | ICD-10-CM | POA: Diagnosis not present

## 2017-07-12 DIAGNOSIS — G5701 Lesion of sciatic nerve, right lower limb: Secondary | ICD-10-CM | POA: Diagnosis not present

## 2017-07-12 DIAGNOSIS — I10 Essential (primary) hypertension: Secondary | ICD-10-CM | POA: Diagnosis not present

## 2017-07-12 DIAGNOSIS — Z6826 Body mass index (BMI) 26.0-26.9, adult: Secondary | ICD-10-CM | POA: Diagnosis not present

## 2017-07-21 DIAGNOSIS — R05 Cough: Secondary | ICD-10-CM | POA: Diagnosis not present

## 2017-07-21 DIAGNOSIS — Z6825 Body mass index (BMI) 25.0-25.9, adult: Secondary | ICD-10-CM | POA: Diagnosis not present

## 2017-07-21 DIAGNOSIS — J069 Acute upper respiratory infection, unspecified: Secondary | ICD-10-CM | POA: Diagnosis not present

## 2017-07-21 MED FILL — AZITHROMYCIN 250 MG TABS: 250 | 5 days supply | Qty: 6 | Fill #0

## 2017-07-21 MED FILL — BENZONATATE 200 MG CAPS: 200 | 10 days supply | Qty: 30 | Fill #0

## 2017-07-25 MED FILL — SHIPPING COST: 1 days supply | Qty: 1 | Fill #8

## 2017-07-25 MED FILL — PRALUENT 150 MG/ML PEN: 150 | 28 days supply | Qty: 2 | Fill #1

## 2017-08-03 DIAGNOSIS — R7301 Impaired fasting glucose: Secondary | ICD-10-CM | POA: Diagnosis not present

## 2017-08-03 DIAGNOSIS — R945 Abnormal results of liver function studies: Secondary | ICD-10-CM | POA: Diagnosis not present

## 2017-08-05 MED FILL — SHIPPING COST: 1 days supply | Qty: 1 | Fill #9

## 2017-08-05 MED FILL — ZOLPIDEM TARTRATE 5 MG TAB: 5 | 30 days supply | Qty: 30 | Fill #0

## 2017-08-12 MED FILL — BUPROPION HCL SR 150 MG TAB: 150 | 90 days supply | Qty: 90 | Fill #1

## 2017-08-12 MED FILL — LABETALOL HCL 200 MG TABS: 200 | 90 days supply | Qty: 180 | Fill #1

## 2017-08-12 MED FILL — PANTOPRAZOLE SOD DR 40 MG T: 40 | 90 days supply | Qty: 90 | Fill #1

## 2017-08-12 MED FILL — SHIPPING COST: 1 days supply | Qty: 1 | Fill #10

## 2017-08-12 MED FILL — AMLODIPINE BESYLATE 5 MG TA: 5 | 90 days supply | Qty: 90 | Fill #1

## 2017-08-12 MED FILL — EZETIMIBE 10 MG TABS: 10 | 90 days supply | Qty: 90 | Fill #1

## 2017-08-18 MED FILL — SHIPPING COST: 1 days supply | Qty: 1 | Fill #11

## 2017-08-18 MED FILL — PRALUENT 150 MG/ML PEN: 150 | 28 days supply | Qty: 2 | Fill #2

## 2017-09-01 MED FILL — ZOLPIDEM TARTRATE 5 MG TAB: 5 | 30 days supply | Qty: 30 | Fill #1

## 2017-09-13 MED FILL — PRALUENT 150 MG/ML PEN: 150 | 28 days supply | Qty: 2 | Fill #3

## 2017-10-03 MED FILL — SHIPPING COST: 1 days supply | Qty: 1 | Fill #12

## 2017-10-03 MED FILL — METHOCARBAMOL 500 MG TABLET: 500 | 90 days supply | Qty: 540 | Fill #2

## 2017-10-03 MED FILL — ZOLPIDEM TARTRATE 5 MG TAB: 5 | 30 days supply | Qty: 30 | Fill #2

## 2017-10-11 DIAGNOSIS — I10 Essential (primary) hypertension: Secondary | ICD-10-CM | POA: Diagnosis not present

## 2017-10-11 DIAGNOSIS — Z6826 Body mass index (BMI) 26.0-26.9, adult: Secondary | ICD-10-CM | POA: Diagnosis not present

## 2017-10-11 DIAGNOSIS — M5416 Radiculopathy, lumbar region: Secondary | ICD-10-CM | POA: Diagnosis not present

## 2017-10-11 DIAGNOSIS — G5701 Lesion of sciatic nerve, right lower limb: Secondary | ICD-10-CM | POA: Diagnosis not present

## 2017-10-11 DIAGNOSIS — R030 Elevated blood-pressure reading, without diagnosis of hypertension: Secondary | ICD-10-CM | POA: Diagnosis not present

## 2017-10-18 MED FILL — OXYCODONE-ACETAMINOPHEN 5-3: 5-325 | 22 days supply | Qty: 90 | Fill #0

## 2017-10-19 MED FILL — PRALUENT 150 MG/ML PEN: 150 | 28 days supply | Qty: 2 | Fill #4

## 2017-10-19 MED FILL — SHIPPING COST: 1 days supply | Qty: 1 | Fill #13

## 2017-11-04 MED FILL — EZETIMIBE 10 MG TABLET: 10 | 90 days supply | Qty: 90 | Fill #2

## 2017-11-04 MED FILL — PANTOPRAZOLE SOD DR 40 MG T: 40 | 90 days supply | Qty: 90 | Fill #2

## 2017-11-04 MED FILL — AMLODIPINE BESYLATE 5 MG TA: 5 | 90 days supply | Qty: 90 | Fill #2

## 2017-11-04 MED FILL — LABETALOL HCL 200 MG TABS: 200 | 90 days supply | Qty: 180 | Fill #2

## 2017-11-04 MED FILL — ZOLPIDEM TARTRATE 5 MG TAB: 5 | 30 days supply | Qty: 30 | Fill #3

## 2017-11-04 MED FILL — BUPROPION SR 150 MG TABLET: 150 | 90 days supply | Qty: 90 | Fill #2

## 2017-11-04 MED FILL — SHIPPING COST: 1 days supply | Qty: 1 | Fill #14

## 2017-11-10 MED FILL — SHIPPING COST: 1 days supply | Qty: 1 | Fill #15

## 2017-11-10 MED FILL — PRALUENT 150 MG/ML PEN: 150 | 28 days supply | Qty: 2 | Fill #5

## 2017-11-30 MED FILL — SF 5000 PLUS CREAM: 1.1 | 15 days supply | Qty: 51 | Fill #0

## 2017-12-06 MED FILL — SHIPPING COST: 1 days supply | Qty: 1 | Fill #16

## 2017-12-06 MED FILL — PRALUENT 150 MG/ML PEN: 150 | 28 days supply | Qty: 2 | Fill #6

## 2017-12-06 MED FILL — ZOLPIDEM TARTRATE 5 MG TAB: 5 | 30 days supply | Qty: 30 | Fill #4

## 2017-12-26 MED FILL — METHOCARBAMOL 500 MG TABLET: 500 | 90 days supply | Qty: 540 | Fill #3

## 2017-12-26 MED FILL — SHIPPING COST: 1 days supply | Qty: 1 | Fill #17

## 2017-12-30 MED FILL — SHIPPING COST: 1 days supply | Qty: 1 | Fill #18

## 2017-12-30 MED FILL — PRALUENT 150 MG/ML PEN: 150 | 28 days supply | Qty: 2 | Fill #7

## 2018-01-05 MED FILL — ZOLPIDEM TARTRATE 5 MG TAB: 5 | 30 days supply | Qty: 30 | Fill #5

## 2018-01-16 MED FILL — OXYCODONE-ACETAMINOPHEN 5-3: 5-325 | 23 days supply | Qty: 90 | Fill #0

## 2018-01-27 MED FILL — BUPROPION HCL SR 150 MG TAB: 150 | 90 days supply | Qty: 90 | Fill #0

## 2018-01-27 MED FILL — PANTOPRAZOLE SOD DR 40 MG T: 40 | 90 days supply | Qty: 90 | Fill #0

## 2018-01-27 MED FILL — LABETALOL HCL 200 MG TABS: 200 | 90 days supply | Qty: 180 | Fill #0

## 2018-01-27 MED FILL — AMLODIPINE BESYLATE 5 MG TA: 5 | 90 days supply | Qty: 90 | Fill #0

## 2018-01-27 MED FILL — SHIPPING COST: 1 days supply | Qty: 1 | Fill #19

## 2018-01-27 MED FILL — EZETIMIBE 10 MG TABS: 10 | 90 days supply | Qty: 90 | Fill #0

## 2018-01-27 MED FILL — PRALUENT 150 MG/ML PEN: 150 | 28 days supply | Qty: 2 | Fill #8

## 2018-02-03 MED FILL — SHIPPING COST: 1 days supply | Qty: 1 | Fill #20

## 2018-02-03 MED FILL — ZOLPIDEM TARTRATE 5 MG TAB: 5 | 30 days supply | Qty: 30 | Fill #0

## 2018-02-08 DIAGNOSIS — Z6825 Body mass index (BMI) 25.0-25.9, adult: Secondary | ICD-10-CM | POA: Diagnosis not present

## 2018-02-08 DIAGNOSIS — I10 Essential (primary) hypertension: Secondary | ICD-10-CM | POA: Diagnosis not present

## 2018-02-08 DIAGNOSIS — M5416 Radiculopathy, lumbar region: Secondary | ICD-10-CM | POA: Diagnosis not present

## 2018-02-12 DIAGNOSIS — M25531 Pain in right wrist: Secondary | ICD-10-CM | POA: Diagnosis not present

## 2018-02-14 ENCOUNTER — Other Ambulatory Visit: Payer: Self-pay | Admitting: Neurosurgery

## 2018-02-14 DIAGNOSIS — M5416 Radiculopathy, lumbar region: Secondary | ICD-10-CM

## 2018-02-23 MED FILL — SHIPPING COST: 1 days supply | Qty: 1 | Fill #21

## 2018-02-23 MED FILL — PRALUENT 150 MG/ML PEN: 150 | 28 days supply | Qty: 2 | Fill #9

## 2018-02-24 ENCOUNTER — Ambulatory Visit
Admission: RE | Admit: 2018-02-24 | Discharge: 2018-02-24 | Disposition: A | Discharge status: Home / Self Care | Payer: 59 | Source: Ambulatory Visit | Attending: Neurosurgery | Admitting: Neurosurgery

## 2018-02-24 DIAGNOSIS — M545 Low back pain: Secondary | ICD-10-CM | POA: Diagnosis not present

## 2018-02-24 DIAGNOSIS — M5416 Radiculopathy, lumbar region: Secondary | ICD-10-CM

## 2018-03-06 MED FILL — SHIPPING COST: 1 days supply | Qty: 1 | Fill #22

## 2018-03-06 MED FILL — ZOLPIDEM TARTRATE 5 MG TAB: 5 | 30 days supply | Qty: 30 | Fill #0

## 2018-03-09 DIAGNOSIS — H5211 Myopia, right eye: Secondary | ICD-10-CM | POA: Diagnosis not present

## 2018-03-09 DIAGNOSIS — R7301 Impaired fasting glucose: Secondary | ICD-10-CM | POA: Diagnosis not present

## 2018-03-09 DIAGNOSIS — H40059 Ocular hypertension, unspecified eye: Secondary | ICD-10-CM | POA: Diagnosis not present

## 2018-03-09 DIAGNOSIS — H04123 Dry eye syndrome of bilateral lacrimal glands: Secondary | ICD-10-CM | POA: Diagnosis not present

## 2018-03-09 DIAGNOSIS — Z125 Encounter for screening for malignant neoplasm of prostate: Secondary | ICD-10-CM | POA: Diagnosis not present

## 2018-03-09 DIAGNOSIS — Z0000 Encounter for general adult medical examination without abnormal findings: Secondary | ICD-10-CM | POA: Diagnosis not present

## 2018-03-09 DIAGNOSIS — R82998 Other abnormal findings in urine: Secondary | ICD-10-CM | POA: Diagnosis not present

## 2018-03-09 DIAGNOSIS — I10 Essential (primary) hypertension: Secondary | ICD-10-CM | POA: Diagnosis not present

## 2018-03-09 DIAGNOSIS — E538 Deficiency of other specified B group vitamins: Secondary | ICD-10-CM | POA: Diagnosis not present

## 2018-03-15 DIAGNOSIS — R945 Abnormal results of liver function studies: Secondary | ICD-10-CM | POA: Diagnosis not present

## 2018-03-15 DIAGNOSIS — F3289 Other specified depressive episodes: Secondary | ICD-10-CM | POA: Diagnosis not present

## 2018-03-15 DIAGNOSIS — E538 Deficiency of other specified B group vitamins: Secondary | ICD-10-CM | POA: Diagnosis not present

## 2018-03-15 DIAGNOSIS — Z1389 Encounter for screening for other disorder: Secondary | ICD-10-CM | POA: Diagnosis not present

## 2018-03-15 DIAGNOSIS — I700 Atherosclerosis of aorta: Secondary | ICD-10-CM | POA: Diagnosis not present

## 2018-03-15 DIAGNOSIS — R808 Other proteinuria: Secondary | ICD-10-CM | POA: Diagnosis not present

## 2018-03-15 DIAGNOSIS — R7301 Impaired fasting glucose: Secondary | ICD-10-CM | POA: Diagnosis not present

## 2018-03-15 DIAGNOSIS — R0789 Other chest pain: Secondary | ICD-10-CM | POA: Diagnosis not present

## 2018-03-15 DIAGNOSIS — Z0000 Encounter for general adult medical examination without abnormal findings: Secondary | ICD-10-CM | POA: Diagnosis not present

## 2018-03-15 DIAGNOSIS — I10 Essential (primary) hypertension: Secondary | ICD-10-CM | POA: Diagnosis not present

## 2018-03-15 MED FILL — VALSARTAN 80 MG TABLET: 80 | 90 days supply | Qty: 90 | Fill #0

## 2018-03-15 MED FILL — ERYTHROMYCIN 2 % GEL: 2 | 30 days supply | Qty: 60 | Fill #0

## 2018-03-16 DIAGNOSIS — Z1212 Encounter for screening for malignant neoplasm of rectum: Secondary | ICD-10-CM | POA: Diagnosis not present

## 2018-03-16 MED FILL — SHIPPING COST: 1 days supply | Qty: 1 | Fill #0

## 2018-03-16 MED FILL — OXYCODONE-ACETAMINOPHEN 5-3: 5-325 | 22 days supply | Qty: 90 | Fill #0

## 2018-03-17 MED FILL — SHIPPING COST: 1 days supply | Qty: 1 | Fill #1

## 2018-03-17 MED FILL — PRALUENT 150 MG/ML PEN: 150 | 28 days supply | Qty: 2 | Fill #10

## 2018-03-20 MED FILL — METHOCARBAMOL 500 MG TABLET: 500 | 90 days supply | Qty: 540 | Fill #0

## 2018-04-05 MED FILL — SHIPPING COST: 1 days supply | Qty: 1 | Fill #2

## 2018-04-05 MED FILL — ZOLPIDEM TARTRATE 5 MG TAB: 5 | 30 days supply | Qty: 30 | Fill #1

## 2018-04-11 MED FILL — PRALUENT 150 MG/ML PEN: 150 | 28 days supply | Qty: 2 | Fill #11

## 2018-04-11 MED FILL — SHIPPING COST: 1 days supply | Qty: 1 | Fill #3

## 2018-04-17 MED FILL — OXYCODONE-ACETAMINOPHEN 5-3: 5-325 | 22 days supply | Qty: 90 | Fill #0

## 2018-04-17 MED FILL — SHIPPING COST: 1 days supply | Qty: 1 | Fill #4

## 2018-04-20 MED FILL — SHIPPING COST: 1 days supply | Qty: 1 | Fill #5

## 2018-04-20 MED FILL — PANTOPRAZOLE SOD DR 40 MG T: 40 | 90 days supply | Qty: 90 | Fill #1

## 2018-04-20 MED FILL — LABETALOL HCL 200 MG TABS: 200 | 90 days supply | Qty: 180 | Fill #1

## 2018-04-20 MED FILL — EZETIMIBE 10 MG TABS: 10 | 90 days supply | Qty: 90 | Fill #1

## 2018-04-20 MED FILL — AMLODIPINE BESYLATE 5 MG TA: 5 | 90 days supply | Qty: 90 | Fill #1

## 2018-04-20 MED FILL — BUPROPION HCL SR 150 MG TAB: 150 | 90 days supply | Qty: 90 | Fill #1

## 2018-05-02 DIAGNOSIS — I10 Essential (primary) hypertension: Secondary | ICD-10-CM | POA: Diagnosis not present

## 2018-05-02 DIAGNOSIS — Z6826 Body mass index (BMI) 26.0-26.9, adult: Secondary | ICD-10-CM | POA: Diagnosis not present

## 2018-05-06 MED FILL — ZOLPIDEM TARTRATE 5 MG TAB: 5 | 30 days supply | Qty: 30 | Fill #2

## 2018-05-08 MED FILL — SHIPPING COST: 1 days supply | Qty: 1 | Fill #6

## 2018-05-16 MED FILL — SHIPPING COST: 1 days supply | Qty: 1 | Fill #7

## 2018-05-16 MED FILL — PRALUENT 150 MG/ML PEN: 150 | 84 days supply | Qty: 6 | Fill #0

## 2018-05-17 MED FILL — OXYCODONE-ACETAMINOPHEN 5-3: 5-325 | 22 days supply | Qty: 90 | Fill #0

## 2018-06-02 MED FILL — ZOLPIDEM TARTRATE 5 MG TAB: 5 | 30 days supply | Qty: 30 | Fill #3

## 2018-06-02 MED FILL — VALSARTAN 80 MG TABLET: 80 | 90 days supply | Qty: 90 | Fill #1

## 2018-06-15 MED FILL — OXYCODONE-ACETAMINOPHEN 5-3: 5-325 | 22 days supply | Qty: 90 | Fill #0

## 2018-06-15 MED FILL — METHOCARBAMOL 500 MG TABLET: 500 | 90 days supply | Qty: 540 | Fill #1

## 2018-07-06 MED FILL — ZOLPIDEM TARTRATE 5 MG TAB: 5 | 30 days supply | Qty: 30 | Fill #4

## 2018-07-14 MED FILL — OXYCODONE-ACETAMINOPHEN 5-3: 5-325 | 22 days supply | Qty: 90 | Fill #0

## 2018-08-01 DIAGNOSIS — I10 Essential (primary) hypertension: Secondary | ICD-10-CM | POA: Diagnosis not present

## 2018-08-01 DIAGNOSIS — M47816 Spondylosis without myelopathy or radiculopathy, lumbar region: Secondary | ICD-10-CM | POA: Diagnosis not present

## 2018-08-01 DIAGNOSIS — G5701 Lesion of sciatic nerve, right lower limb: Secondary | ICD-10-CM | POA: Diagnosis not present

## 2018-08-01 DIAGNOSIS — Z6826 Body mass index (BMI) 26.0-26.9, adult: Secondary | ICD-10-CM | POA: Diagnosis not present

## 2018-08-01 MED FILL — tiZANidine HCL 4 MG TABS: 4 | 30 days supply | Qty: 90 | Fill #0

## 2018-08-04 MED FILL — ZOLPIDEM TARTRATE 5 MG TAB: 5 | 30 days supply | Qty: 30 | Fill #5

## 2018-08-11 MED FILL — BUPROPION HCL SR 150 MG TAB: 150 | 90 days supply | Qty: 90 | Fill #0

## 2018-08-11 MED FILL — EZETIMIBE 10 MG TABS: 10 | 90 days supply | Qty: 90 | Fill #0

## 2018-08-11 MED FILL — PANTOPRAZOLE SOD DR 40 MG T: 40 | 90 days supply | Qty: 90 | Fill #0

## 2018-08-11 MED FILL — LABETALOL HCL 200 MG TABS: 200 | 90 days supply | Qty: 180 | Fill #0

## 2018-08-11 MED FILL — AMLODIPINE BESYLATE 5 MG TA: 5 | 90 days supply | Qty: 90 | Fill #0

## 2018-08-14 MED FILL — OXYCODONE-ACETAMINOPHEN 5-3: 5-325 | 23 days supply | Qty: 90 | Fill #0

## 2018-08-15 MED FILL — TELMISARTAN 20 MG TABS: 20 | 30 days supply | Qty: 30 | Fill #0

## 2018-08-29 MED FILL — PRALUENT 150 MG/ML PEN: 150 | 90 days supply | Qty: 6 | Fill #1

## 2018-08-29 MED FILL — tiZANidine HCL 4 MG TABS: 4 | 30 days supply | Qty: 90 | Fill #1

## 2018-08-31 DIAGNOSIS — M47816 Spondylosis without myelopathy or radiculopathy, lumbar region: Secondary | ICD-10-CM | POA: Diagnosis not present

## 2018-08-31 MED FILL — ZOLPIDEM TARTRATE 5 MG TAB: 5 | 30 days supply | Qty: 30 | Fill #0

## 2018-09-13 MED FILL — TELMISARTAN 20 MG TABS: 20 | 30 days supply | Qty: 30 | Fill #1

## 2018-09-13 MED FILL — OXYCODONE-ACETAMINOPHEN 5-3: 5-325 | 23 days supply | Qty: 90 | Fill #0

## 2018-09-14 DIAGNOSIS — M47816 Spondylosis without myelopathy or radiculopathy, lumbar region: Secondary | ICD-10-CM | POA: Diagnosis not present

## 2018-09-14 DIAGNOSIS — G5701 Lesion of sciatic nerve, right lower limb: Secondary | ICD-10-CM | POA: Diagnosis not present

## 2018-10-03 MED FILL — ZOLPIDEM TARTRATE 5 MG TAB: 5 | 30 days supply | Qty: 30 | Fill #1

## 2018-10-05 MED FILL — tiZANidine HCL 4 MG TABS: 4 | 30 days supply | Qty: 90 | Fill #0

## 2018-10-09 ENCOUNTER — Other Ambulatory Visit: Payer: Self-pay | Admitting: Internal Medicine

## 2018-10-09 ENCOUNTER — Other Ambulatory Visit (HOSPITAL_COMMUNITY): Payer: Self-pay | Admitting: Internal Medicine

## 2018-10-09 DIAGNOSIS — R1011 Right upper quadrant pain: Secondary | ICD-10-CM

## 2018-10-12 ENCOUNTER — Ambulatory Visit (HOSPITAL_COMMUNITY)
Admission: RE | Admit: 2018-10-12 | Discharge: 2018-10-12 | Disposition: A | Discharge status: Home / Self Care | Payer: 59 | Source: Ambulatory Visit | Attending: Internal Medicine | Admitting: Internal Medicine

## 2018-10-12 DIAGNOSIS — K824 Cholesterolosis of gallbladder: Secondary | ICD-10-CM | POA: Diagnosis not present

## 2018-10-12 DIAGNOSIS — R1011 Right upper quadrant pain: Secondary | ICD-10-CM | POA: Insufficient documentation

## 2018-10-16 DIAGNOSIS — R1011 Right upper quadrant pain: Secondary | ICD-10-CM | POA: Diagnosis not present

## 2018-10-16 DIAGNOSIS — E7849 Other hyperlipidemia: Secondary | ICD-10-CM | POA: Diagnosis not present

## 2018-10-16 DIAGNOSIS — R0789 Other chest pain: Secondary | ICD-10-CM | POA: Diagnosis not present

## 2018-10-16 DIAGNOSIS — R945 Abnormal results of liver function studies: Secondary | ICD-10-CM | POA: Diagnosis not present

## 2018-10-16 DIAGNOSIS — I10 Essential (primary) hypertension: Secondary | ICD-10-CM | POA: Diagnosis not present

## 2018-10-16 DIAGNOSIS — R7301 Impaired fasting glucose: Secondary | ICD-10-CM | POA: Diagnosis not present

## 2018-10-16 DIAGNOSIS — N200 Calculus of kidney: Secondary | ICD-10-CM | POA: Diagnosis not present

## 2018-10-16 DIAGNOSIS — E538 Deficiency of other specified B group vitamins: Secondary | ICD-10-CM | POA: Diagnosis not present

## 2018-10-16 DIAGNOSIS — K219 Gastro-esophageal reflux disease without esophagitis: Secondary | ICD-10-CM | POA: Diagnosis not present

## 2018-10-16 DIAGNOSIS — R1031 Right lower quadrant pain: Secondary | ICD-10-CM | POA: Diagnosis not present

## 2018-10-16 MED FILL — OXYCODONE-ACETAMINOPHEN 5-3: 5-325 | 22 days supply | Qty: 90 | Fill #0

## 2018-10-16 MED FILL — ONDANSETRON ODT 8 MG TABLET: 8 | 7 days supply | Qty: 20 | Fill #0

## 2018-10-16 MED FILL — TELMISARTAN 80 MG TABLET: 80 | 90 days supply | Qty: 90 | Fill #0

## 2018-10-17 ENCOUNTER — Other Ambulatory Visit: Payer: Self-pay | Admitting: Internal Medicine

## 2018-10-17 ENCOUNTER — Ambulatory Visit
Admission: RE | Admit: 2018-10-17 | Discharge: 2018-10-17 | Disposition: A | Discharge status: Home / Self Care | Payer: 59 | Source: Ambulatory Visit | Attending: Internal Medicine | Admitting: Internal Medicine

## 2018-10-17 ENCOUNTER — Other Ambulatory Visit: Payer: Self-pay

## 2018-10-17 DIAGNOSIS — R110 Nausea: Secondary | ICD-10-CM | POA: Diagnosis not present

## 2018-10-17 DIAGNOSIS — R1031 Right lower quadrant pain: Secondary | ICD-10-CM

## 2018-10-17 DIAGNOSIS — R1011 Right upper quadrant pain: Secondary | ICD-10-CM

## 2018-10-17 DIAGNOSIS — K7689 Other specified diseases of liver: Secondary | ICD-10-CM | POA: Diagnosis not present

## 2018-10-17 MED ORDER — IOPAMIDOL (ISOVUE-300) INJECTION 61%
100.0000 mL | Freq: Once | INTRAVENOUS | Status: AC | PRN
  Administered 2018-10-17: 100 mL via INTRAVENOUS

## 2018-10-26 MED FILL — ONDANSETRON ODT 8 MG TABLET: 8 | 7 days supply | Qty: 20 | Fill #1

## 2018-11-01 MED FILL — ZOLPIDEM TARTRATE 5 MG TAB: 5 | 30 days supply | Qty: 30 | Fill #2

## 2018-11-01 MED FILL — tiZANidine HCL 4 MG TABS: 4 | 30 days supply | Qty: 90 | Fill #1

## 2018-11-07 ENCOUNTER — Ambulatory Visit: Payer: 59 | Admitting: Gastroenterology

## 2018-11-07 MED FILL — BUPROPION HCL SR 150 MG TAB: 150 | 90 days supply | Qty: 90 | Fill #1

## 2018-11-07 MED FILL — PANTOPRAZOLE SOD DR 40 MG T: 40 | 90 days supply | Qty: 90 | Fill #1

## 2018-11-07 MED FILL — ONDANSETRON ODT 8 MG TABLET: 8 | 7 days supply | Qty: 20 | Fill #2

## 2018-11-07 MED FILL — EZETIMIBE 10 MG TABS: 10 | 90 days supply | Qty: 90 | Fill #1

## 2018-11-07 MED FILL — AMLODIPINE BESYLATE 5 MG TA: 5 | 90 days supply | Qty: 90 | Fill #1

## 2018-11-07 MED FILL — LABETALOL HCL 200 MG TABLET: 200 | 90 days supply | Qty: 180 | Fill #1

## 2018-11-08 ENCOUNTER — Encounter: Payer: Self-pay | Admitting: Physician Assistant

## 2018-11-08 ENCOUNTER — Encounter: Payer: Self-pay | Admitting: Gastroenterology

## 2018-11-08 ENCOUNTER — Other Ambulatory Visit (HOSPITAL_COMMUNITY)
Admission: RE | Admit: 2018-11-08 | Discharge: 2018-11-08 | Disposition: A | Discharge status: Home / Self Care | Payer: 59 | Source: Ambulatory Visit | Attending: Gastroenterology | Admitting: Gastroenterology

## 2018-11-08 ENCOUNTER — Other Ambulatory Visit: Payer: Self-pay

## 2018-11-08 ENCOUNTER — Ambulatory Visit (INDEPENDENT_AMBULATORY_CARE_PROVIDER_SITE_OTHER): Payer: 59 | Admitting: Physician Assistant

## 2018-11-08 VITALS — BP 130/70 | HR 89 | Temp 98.0°F | Ht 71.0 in | Wt 183.5 lb

## 2018-11-08 DIAGNOSIS — Z20828 Contact with and (suspected) exposure to other viral communicable diseases: Secondary | ICD-10-CM | POA: Diagnosis not present

## 2018-11-08 DIAGNOSIS — R1013 Epigastric pain: Secondary | ICD-10-CM | POA: Diagnosis not present

## 2018-11-08 DIAGNOSIS — R1011 Right upper quadrant pain: Secondary | ICD-10-CM

## 2018-11-08 DIAGNOSIS — Z01812 Encounter for preprocedural laboratory examination: Secondary | ICD-10-CM | POA: Diagnosis not present

## 2018-11-08 DIAGNOSIS — R112 Nausea with vomiting, unspecified: Secondary | ICD-10-CM

## 2018-11-08 LAB — SARS CORONAVIRUS 2 (TAT 6-24 HRS): SARS Coronavirus 2: NEGATIVE

## 2018-11-08 NOTE — Progress Notes (Signed)
Chief Complaint: Abdominal pain, gas and bloating  HPI:    [name] is a 53 year old male with a past medical history as listed below, known to Dr. Ardis Hughs, who was referred to me by Chase Infante, MD for a complaint of abdominal pain, gas and bloating.      11/17/2016 colonoscopy Dr. Ardis Hughs for personal history of colonic polyps in 2009 and 2013.  Seven 2-8 mm polyps in the descending and transverse colon.  Pathology showed adenomatous polyps.  Repeat recommended in 3 years.    10/12/2018 right upper quadrant ultrasound with a 4 mm polyp in the gallbladder.  Size did not warrant additional imaging surveillance.  Otherwise unremarkable.  10/16/2018 labs including CBC, CMP and urinalysis were normal.  H. pylori IgG was elevated but IgM negative.  10/17/2018 CT abdomen pelvis with contrast with no cause for presenting symptoms.  No evidence of bowel pathology.  Normal appendix.    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  He then remained bloated with abdominal pain rated as an 8-9/10 which was mainly his right upper quadrant.  He contacted his PCP who ordered right upper quadrant ultrasound as above, then CT as above and labs which were all fairly normal.  Was also experiencing some continued nausea. Placed himself on a BRAT diet over the past 4 weeks and pain is slowly going away, now with only 1-2/10 right upper quadrant abdominal pain.  He has started again on more regular food.  Denies heartburn or reflux and is taking his Omeprazole 40 mg daily.    Also notes that his bowel movements over the past year are somewhat changed in that he will have a soft normal stool followed by a "ragged stool with undigested food".  This has been happening over the past year and has now returned with him eating regular food.  Will have a bowel movement 1-2 times per day.      Patient tells me he is due  to have an extensive dental procedure next Wednesday, 11/15/2018.  He would like this pain further evaluated before then.    Denies fever, chills, weight loss, anorexia or symptoms that awaken him from sleep.     Past Medical History:  Diagnosis Date  . Adenomatous colon polyp   . Anal fissure   . Arthritis   . Chest pain   . Depression   . Helicobacter pylori (H. pylori)   . Hemorrhoids   . Hyperlipidemia   . Hypertension   . Peptic ulcer, unspecified site, unspecified as acute or chronic, without mention of hemorrhage, perforation, or obstruction   . Pneumonia   . Posttraumatic stress disorder \  . Renal calculus    hx of  . Rhabdomyolysis     Past Surgical History:  Procedure Laterality Date  . COLONOSCOPY  2015  . HEMORRHOID SURGERY    . NASAL SINUS SURGERY    . SHOULDER SURGERY Left    thermal cap  . SPHINCTEROTOMY      Current Outpatient Medications  Medication Sig Dispense Refill  . amLODipine (NORVASC) 5 MG tablet TAKE 1 TABLET BY MOUTH DAILY. 30 tablet 0  . buPROPion (WELLBUTRIN SR) 150 MG 12 hr tablet Take 150 mg by mouth daily.    . Erythromycin 2 % ointment Apply topically 2 (two) times daily. For acne    . labetalol (NORMODYNE) 200  MG tablet Take 1 tablet (200 mg total) by mouth 2 (two) times daily. 180 tablet 5  . loratadine-pseudoephedrine (CLARITIN-D 12-HOUR) 5-120 MG per tablet Take 1 tablet by mouth daily as needed. For seasonal allergies    . methocarbamol (ROBAXIN) 750 MG tablet Take 750 mg by mouth. Every 6-8 hours as needed    . oxyCODONE-acetaminophen (PERCOCET) 5-325 MG per tablet Take 1 tablet by mouth every 4 (four) hours as needed. For pain    . pantoprazole (PROTONIX) 40 MG tablet TAKE 1 TABLET BY MOUTH DAILY. (MUST MAKE DOCTORS APPT FOR MORE REFILLS) 90 tablet 5  . PRALUENT 150 MG/ML SOPN Inject 1 Dose into the skin every 14 (fourteen) days.  3  . triamcinolone (NASACORT ALLERGY 24HR) 55 MCG/ACT AERO nasal inhaler Place 2 sprays into the nose  daily.    Marland Kitchen ZETIA 10 MG tablet TAKE 1 TABLET (10 MG TOTAL) BY MOUTH DAILY. 90 tablet 3  . zolpidem (AMBIEN) 5 MG tablet Take 1 tablet by mouth at bedtime.  3   Current Facility-Administered Medications  Medication Dose Route Frequency Provider Last Rate Last Dose  . 0.9 %  sodium chloride infusion  500 mL Intravenous Continuous Milus Banister, MD        Allergies as of 11/08/2018 - Review Complete 11/08/2018  Allergen Reaction Noted  . Statins Other (See Comments) 06/10/2008  . Tricor [fenofibrate] Other (See Comments) 03/21/2013  . Bio-statin [nystatin]  10/05/2013  . Diphenhydramine hcl  06/10/2008  . Latex  06/10/2008  . Citrucel [methylcellulose] Diarrhea 11/10/2016    Family History  Problem Relation Age of Onset  . Alzheimer's disease Mother 78       died  . Hyperlipidemia Father 53       alive -and back probldems  . Colon polyps Father   . Thyroid cancer Father   . Aplastic anemia Father   . Hematuria Brother 102  . Diabetes Paternal Grandmother   . Colon polyps Paternal Grandmother   . Diabetes Maternal Grandmother   . Colon polyps Maternal Grandmother   . Colon cancer Neg Hx     Social History   Socioeconomic History  . Marital status: Single    Spouse name: Not on file  . Number of children: 0  . Years of education: Not on file  . Highest education level: Not on file  Occupational History  . Occupation: works ED    Employer: [employer]  . Financial resource strain: Not on file  . Food insecurity    Worry: Not on file    Inability: Not on file  . Transportation needs    Medical: Not on file    Non-medical: Not on file  Tobacco Use  . Smoking status: Never Smoker  . Smokeless tobacco: Current User    Types: Chew  . Tobacco comment: pt. chew 2 packs of tobacco a week  Substance and Sexual Activity  . Alcohol use: Yes    Comment: 1-2 per month per patient  . Drug use: No  . Sexual activity: Not on file  Lifestyle  . Physical  activity    Days per week: Not on file    Minutes per session: Not on file  . Stress: Not on file  Relationships  . Social Herbalist on phone: Not on file    Gets together: Not on file    Attends religious service: Not on file    Active member of club or  organization: Not on file    Attends meetings of clubs or organizations: Not on file    Relationship status: Not on file  . Intimate partner violence    Fear of current or ex partner: Not on file    Emotionally abused: Not on file    Physically abused: Not on file    Forced sexual activity: Not on file  Other Topics Concern  . Not on file  Social History Narrative  . Not on file    Review of Systems:    Constitutional: No weight loss, fever or chills Cardiovascular: No chest pain Respiratory: No SOB Gastrointestinal: See HPI and otherwise negative   Physical Exam:  Vital signs: BP 130/70 (BP Location: Left Arm, Patient Position: Sitting, Cuff Size: Normal)   Pulse 89   Temp 98 F (36.7 C)   Ht 5\' 11"  (1.803 m)   Wt 183 lb 8 oz (83.2 kg)   BMI 25.59 kg/m   Constitutional:   Pleasant Caucasian male appears to be in NAD, Well developed, Well nourished, alert and cooperative Respiratory: Respirations even and unlabored. Lungs clear to auscultation bilaterally.   No wheezes, crackles, or rhonchi.  Cardiovascular: Normal S1, S2. No MRG. Regular rate and rhythm. No peripheral edema, cyanosis or pallor.  Gastrointestinal:  Soft, nondistended, mild RUQ ttp. No rebound or guarding. Normal bowel sounds. No appreciable masses or hepatomegaly. Rectal:  Not performed.  Psychiatric: Demonstrates good judgement and reason without abnormal affect or behaviors.  SEE HPI for recent labs and imaging.  Assessment: 1.  Right upper quadrant/epigastric pain: See below 2.  Nausea and vomiting: 1 episode of vomiting with abrupt pain after eating greasy food, then continued nausea and abdominal pain which are getting better over the  past month with BRAT diet, negative CT and ultrasound as well as labs; consider ulcer +/- H pylori 3. Change in bowel habits: Consider diet related vs relation to gastritis/upper symptoms  Plan: 1.  Discussed with patient that he possibly had an ulcer which is healing now with negative ultrasound and CT as well as normal labs.  Patient would like further evaluation, also discussed that we may or may not see anything via EGD as it has now been a month since initial symptoms and he is feeling some better.  He is having major dental procedure next Wednesday and requests something this week.  Scheduled patient for an EGD in the [facility] with Dr. Ardis Hughs at 230 on 11/10/2018.  Discussed risks, benefits, limitations and alternatives and the patient agrees to proceed. 2.  Patient tells me that he is being COVID tested for his upcoming dental procedures but does not have any symptoms. 3.  Would recommend patient continue Omeprazole 40 mg daily 4.  Would recommend the patient start a high-fiber diet and probiotic for change in bowel habits. 5.  Patient to follow in clinic recommendations from Dr. Ardis Hughs after time procedure.  Ellouise Newer, PA-C [facility] 11/08/2018, 10:34 AM  Cc: Chase Infante, MD

## 2018-11-08 NOTE — Patient Instructions (Signed)
You have been scheduled for an endoscopy. Please follow written instructions given to you at your visit today. If you use inhalers (even only as needed), please bring them with you on the day of your procedure.   

## 2018-11-08 NOTE — Progress Notes (Signed)
I agree with the above note, plan 

## 2018-11-09 ENCOUNTER — Telehealth: Payer: Self-pay

## 2018-11-09 NOTE — Telephone Encounter (Signed)
Covid-19 screening questions   Do you now or have you had a fever in the last 14 days? NO   Do you have any respiratory symptoms of shortness of breath or cough now or in the last 14 days? NO  Do you have any family members or close contacts with diagnosed or suspected Covid-19 in the past 14 days? NO  Have you been tested for Covid-19 and found to be positive? NO        

## 2018-11-10 ENCOUNTER — Ambulatory Visit (AMBULATORY_SURGERY_CENTER): Payer: 59 | Admitting: Gastroenterology

## 2018-11-10 ENCOUNTER — Encounter: Payer: Self-pay | Admitting: Gastroenterology

## 2018-11-10 ENCOUNTER — Other Ambulatory Visit: Payer: Self-pay

## 2018-11-10 VITALS — BP 125/82 | HR 63 | Temp 98.8°F | Resp 10 | Ht 71.0 in | Wt 183.0 lb

## 2018-11-10 DIAGNOSIS — K2970 Gastritis, unspecified, without bleeding: Secondary | ICD-10-CM | POA: Diagnosis not present

## 2018-11-10 DIAGNOSIS — K219 Gastro-esophageal reflux disease without esophagitis: Secondary | ICD-10-CM | POA: Diagnosis not present

## 2018-11-10 DIAGNOSIS — K3189 Other diseases of stomach and duodenum: Secondary | ICD-10-CM | POA: Diagnosis not present

## 2018-11-10 DIAGNOSIS — R1011 Right upper quadrant pain: Secondary | ICD-10-CM | POA: Diagnosis not present

## 2018-11-10 DIAGNOSIS — R112 Nausea with vomiting, unspecified: Secondary | ICD-10-CM | POA: Diagnosis not present

## 2018-11-10 DIAGNOSIS — R1013 Epigastric pain: Secondary | ICD-10-CM | POA: Diagnosis not present

## 2018-11-10 DIAGNOSIS — I10 Essential (primary) hypertension: Secondary | ICD-10-CM | POA: Diagnosis not present

## 2018-11-10 MED ORDER — SODIUM CHLORIDE 0.9 % IV SOLN: 500.0000 mL | Freq: Once | INTRAVENOUS | Status: DC

## 2018-11-10 NOTE — Progress Notes (Signed)
Report to PACU, RN, vss, BBS= Clear.  

## 2018-11-10 NOTE — Progress Notes (Signed)
Called to room to assist during endoscopic procedure.  Patient ID and intended procedure confirmed with present staff. Received instructions for my participation in the procedure from the performing physician.  

## 2018-11-10 NOTE — Patient Instructions (Signed)
Thank you for allowing [place] to care for you today!  Await pathology results of the upper endoscopy, approximately 2 weeks.    Resume your previous modified diet as before.  Progress slowly to a regular diet .  Return to your normal activities tomorrow.     YOU HAD AN ENDOSCOPIC PROCEDURE TODAY AT [facility]:   Refer to the procedure report that was given to you for any specific questions about what was found during the examination.  If the procedure report does not answer your questions, please call your gastroenterologist to clarify.  If you requested that your care partner not be given the details of your procedure findings, then the procedure report has been included in a sealed envelope for you to review at your convenience later.  YOU SHOULD EXPECT: Some feelings of bloating in the abdomen. Passage of more gas than usual.  Walking can help get rid of the air that was put into your GI tract during the procedure and reduce the bloating. If you had a lower endoscopy (such as a colonoscopy or flexible sigmoidoscopy) you may notice spotting of blood in your stool or on the toilet paper. If you underwent a bowel prep for your procedure, you may not have a normal bowel movement for a few days.  Please Note:  You might notice some irritation and congestion in your nose or some drainage.  This is from the oxygen used during your procedure.  There is no need for concern and it should clear up in a day or so.  SYMPTOMS TO REPORT IMMEDIATELY:   Following lower endoscopy (colonoscopy or flexible sigmoidoscopy):  Excessive amounts of blood in the stool  Significant tenderness or worsening of abdominal pains  Swelling of the abdomen that is new, acute  Fever of 100F or higher   For urgent or emergent issues, a gastroenterologist can be reached at any hour by calling [phone].   DIET:  We do recommend a small meal at first, but then you may proceed to your regular diet.   Drink plenty of fluids but you should avoid alcoholic beverages for 24 hours.  ACTIVITY:  You should plan to take it easy for the rest of today and you should NOT DRIVE or use heavy machinery until tomorrow (because of the sedation medicines used during the test).    FOLLOW UP: Our staff will call the number listed on your records 48-72 hours following your procedure to check on you and address any questions or concerns that you may have regarding the information given to you following your procedure. If we do not reach you, we will leave a message.  We will attempt to reach you two times.  During this call, we will ask if you have developed any symptoms of COVID 19. If you develop any symptoms (ie: fever, flu-like symptoms, shortness of breath, cough etc.) before then, please call [phone].  If you test positive for Covid 19 in the 2 weeks post procedure, please call and report this information to [place].    If any biopsies were taken you will be contacted by phone or by letter within the next 1-3 weeks.  Please call us at [phone] if you have not heard about the biopsies in 3 weeks.    SIGNATURES/CONFIDENTIALITY: You and/or your care partner have signed paperwork which will be entered into your electronic medical record.  These signatures attest to the fact that that the information above on your After Visit  Summary has been reviewed and is understood.  Full responsibility of the confidentiality of this discharge information lies with you and/or your care-partner. 

## 2018-11-10 NOTE — Op Note (Signed)
[facility] Patient Name: [name] Procedure Date: 11/10/2018 2:35 PM MRN: [id number] Endoscopist: Milus Banister , MD Age: 53 Referring MD:  Date of Birth: September 08, 1965 Gender: Male Account #: [id number] Procedure:                Upper GI endoscopy Indications:              Abdominal pain in the right upper quadrant Medicines:                Monitored Anesthesia Care Procedure:                Pre-Anesthesia Assessment:                           - Prior to the procedure, a History and Physical                            was performed, and patient medications and                            allergies were reviewed. The patient's tolerance of                            previous anesthesia was also reviewed. The risks                            and benefits of the procedure and the sedation                            options and risks were discussed with the patient.                            All questions were answered, and informed consent                            was obtained. Prior Anticoagulants: The patient has                            taken no previous anticoagulant or antiplatelet                            agents. ASA Grade Assessment: II - A patient with                            mild systemic disease. After reviewing the risks                            and benefits, the patient was deemed in                            satisfactory condition to undergo the procedure.                           After obtaining informed consent, the endoscope was  passed under direct vision. Throughout the                            procedure, the patient's blood pressure, pulse, and                            oxygen saturations were monitored continuously. The                            Endoscope was introduced through the mouth, and                            advanced to the second part of duodenum. The upper                            GI endoscopy  was accomplished without difficulty.                            The patient tolerated the procedure well. Scope In: Scope Out: Findings:                 Mild inflammation characterized by erythema and                            friability was found in the gastric antrum.                            Biopsies were taken with a cold forceps for                            histology.                           The exam was otherwise without abnormality. Complications:            No immediate complications. Estimated blood loss:                            None. Estimated Blood Loss:     Estimated blood loss: none. Impression:               - Mild gastritis, biopsied to check for H. pylori.                           - The examination was otherwise normal. Recommendation:           - Patient has a contact number available for                            emergencies. The signs and symptoms of potential                            delayed complications were discussed with the                            patient. Return to normal activities tomorrow.  Written discharge instructions were provided to the                            patient.                           - Resume previous diet. Slowly advance to a more                            regular diet over the next several days.                           - Continue present medications.                           - Await pathology results. Milus Banister, MD 11/10/2018 3:09:09 PM This report has been signed electronically.

## 2018-11-14 MED FILL — OXYCODONE-ACETAMINOPHEN 5-3: 5-325 | 22 days supply | Qty: 90 | Fill #0

## 2018-11-15 ENCOUNTER — Telehealth: Payer: Self-pay | Admitting: *Deleted

## 2018-11-15 NOTE — Telephone Encounter (Signed)
  Follow up Call-  Call back number 11/10/2018 11/17/2016  Post procedure Call Back phone  # [phone]  Permission to leave phone message Yes Yes  Some recent data might be hidden    [phone] Patient questions:  Do you have a fever, pain , or abdominal swelling? No. Pain Score  0 *  Have you tolerated food without any problems? Yes.    Have you been able to return to your normal activities? Yes.    Do you have any questions about your discharge instructions: Diet   No. Medications  No. Follow up visit  No.  Do you have questions or concerns about your Care? No.  Actions: * If pain score is 4 or above: No action needed, pain <4.  1. Have you developed a fever since your procedure? no  2.   Have you had an respiratory symptoms (SOB or cough) since your procedure? no  3.   Have you tested positive for COVID 19 since your procedure no  4.   Have you had any family members/close contacts diagnosed with the COVID 19 since your procedure?  no   If yes to any of these questions please route to Joylene John, RN and Alphonsa Gin, Therapist, sports.

## 2018-11-17 ENCOUNTER — Encounter: Payer: Self-pay | Admitting: Gastroenterology

## 2018-11-29 DIAGNOSIS — M47816 Spondylosis without myelopathy or radiculopathy, lumbar region: Secondary | ICD-10-CM | POA: Diagnosis not present

## 2018-11-29 DIAGNOSIS — G5701 Lesion of sciatic nerve, right lower limb: Secondary | ICD-10-CM | POA: Diagnosis not present

## 2018-11-30 MED FILL — ZOLPIDEM TARTRATE 5 MG TAB: 5 | 30 days supply | Qty: 30 | Fill #3

## 2018-11-30 MED FILL — SODIUM FLUORIDE 5000 PPM 1.: 1.1 | 30 days supply | Qty: 100 | Fill #0

## 2018-12-07 MED FILL — PRALUENT 150 MG/ML SOAJ: 150 | 84 days supply | Qty: 6 | Fill #0

## 2018-12-10 MED FILL — CHLORHEXIDINE 0.12% RINSE: 0.12 | 16 days supply | Qty: 473 | Fill #0

## 2018-12-15 MED FILL — OXYCODONE-ACETAMINOPHEN 5-3: 5-325 | 22 days supply | Qty: 90 | Fill #0

## 2019-01-01 MED FILL — ZOLPIDEM TARTRATE 5 MG TAB: 5 | 30 days supply | Qty: 30 | Fill #4

## 2019-01-10 MED FILL — TELMISARTAN 80 MG TABLET: 80 | 90 days supply | Qty: 90 | Fill #1

## 2019-01-17 MED FILL — OXYCODONE-ACETAMINOPHEN 5-3: 5-325 | 22 days supply | Qty: 90 | Fill #0

## 2019-01-29 MED FILL — ZOLPIDEM TARTRATE 5 MG TAB: 5 | 30 days supply | Qty: 30 | Fill #5

## 2019-01-30 MED FILL — BUPROPION HCL SR 150 MG TAB: 150 | 90 days supply | Qty: 90 | Fill #2

## 2019-01-30 MED FILL — LABETALOL HCL 200 MG TABLET: 200 | 90 days supply | Qty: 180 | Fill #2

## 2019-01-30 MED FILL — AMLODIPINE BESYLATE 5 MG TA: 5 | 90 days supply | Qty: 90 | Fill #2

## 2019-01-30 MED FILL — EZETIMIBE 10 MG TABS: 10 | 90 days supply | Qty: 90 | Fill #2

## 2019-01-30 MED FILL — PANTOPRAZOLE SOD DR 40 MG T: 40 | 90 days supply | Qty: 90 | Fill #2

## 2019-01-30 MED FILL — ONDANSETRON ODT 8 MG TABLET: 8 | 7 days supply | Qty: 20 | Fill #3

## 2019-02-07 DIAGNOSIS — M47816 Spondylosis without myelopathy or radiculopathy, lumbar region: Secondary | ICD-10-CM | POA: Diagnosis not present

## 2019-02-07 DIAGNOSIS — G5701 Lesion of sciatic nerve, right lower limb: Secondary | ICD-10-CM | POA: Diagnosis not present

## 2019-02-07 MED FILL — tiZANidine HCL 4 MG TABS: 4 | 30 days supply | Qty: 90 | Fill #0

## 2019-02-16 MED FILL — OXYCODONE-ACETAMINOPHEN 5-3: 5-325 | 22 days supply | Qty: 90 | Fill #0

## 2019-02-25 ENCOUNTER — Emergency Department (HOSPITAL_COMMUNITY)
Admission: EM | Admit: 2019-02-25 | Discharge: 2019-02-25 | Disposition: A | Discharge status: Home / Self Care | Payer: 59 | Attending: Emergency Medicine | Admitting: Emergency Medicine

## 2019-02-25 ENCOUNTER — Emergency Department (HOSPITAL_COMMUNITY): Payer: 59

## 2019-02-25 ENCOUNTER — Encounter (HOSPITAL_COMMUNITY): Payer: Self-pay | Admitting: Emergency Medicine

## 2019-02-25 DIAGNOSIS — Z91040 Latex allergy status: Secondary | ICD-10-CM | POA: Diagnosis not present

## 2019-02-25 DIAGNOSIS — R634 Abnormal weight loss: Secondary | ICD-10-CM | POA: Insufficient documentation

## 2019-02-25 DIAGNOSIS — Z79899 Other long term (current) drug therapy: Secondary | ICD-10-CM | POA: Diagnosis not present

## 2019-02-25 DIAGNOSIS — I10 Essential (primary) hypertension: Secondary | ICD-10-CM | POA: Diagnosis not present

## 2019-02-25 DIAGNOSIS — R42 Dizziness and giddiness: Secondary | ICD-10-CM | POA: Diagnosis present

## 2019-02-25 DIAGNOSIS — R202 Paresthesia of skin: Secondary | ICD-10-CM | POA: Diagnosis not present

## 2019-02-25 DIAGNOSIS — I959 Hypotension, unspecified: Secondary | ICD-10-CM | POA: Insufficient documentation

## 2019-02-25 DIAGNOSIS — F17220 Nicotine dependence, chewing tobacco, uncomplicated: Secondary | ICD-10-CM | POA: Insufficient documentation

## 2019-02-25 DIAGNOSIS — R531 Weakness: Secondary | ICD-10-CM | POA: Diagnosis not present

## 2019-02-25 DIAGNOSIS — Z03818 Encounter for observation for suspected exposure to other biological agents ruled out: Secondary | ICD-10-CM | POA: Diagnosis not present

## 2019-02-25 LAB — BASIC METABOLIC PANEL
Anion gap: 10 (ref 5–15)
BUN: 10 mg/dL (ref 6–20)
CO2: 24 mmol/L (ref 22–32)
Calcium: 9.1 mg/dL (ref 8.9–10.3)
Chloride: 107 mmol/L (ref 98–111)
Creatinine, Ser: 1.02 mg/dL (ref 0.61–1.24)
GFR calc Af Amer: >60 mL/min (ref >60)
GFR calc non Af Amer: >60 mL/min (ref >60)
Glucose, Bld: 127 mg/dL — ABNORMAL HIGH (ref 70–99)
Potassium: 3.3 mmol/L — ABNORMAL LOW (ref 3.5–5.1)
Sodium: 141 mmol/L (ref 135–145)

## 2019-02-25 LAB — CBC WITH DIFFERENTIAL/PLATELET
Abs Immature Granulocytes: 0.01 10*3/uL (ref 0.00–0.07)
Basophils Absolute: 0 10*3/uL (ref 0.0–0.1)
Basophils Relative: 1 %
Eosinophils Absolute: 0.2 10*3/uL (ref 0.0–0.5)
Eosinophils Relative: 3 %
HCT: 33 % — ABNORMAL LOW (ref 39.0–52.0)
Hemoglobin: 11.4 g/dL — ABNORMAL LOW (ref 13.0–17.0)
Immature Granulocytes: 0 %
Lymphocytes Relative: 27 %
Lymphs Abs: 1.3 10*3/uL (ref 0.7–4.0)
MCH: 29.6 pg (ref 26.0–34.0)
MCHC: 34.5 g/dL (ref 30.0–36.0)
MCV: 85.7 fL (ref 80.0–100.0)
Monocytes Absolute: 0.4 10*3/uL (ref 0.1–1.0)
Monocytes Relative: 9 %
Neutro Abs: 2.9 10*3/uL (ref 1.7–7.7)
Neutrophils Relative %: 60 %
Platelets: 225 10*3/uL (ref 150–400)
RBC: 3.85 MIL/uL — ABNORMAL LOW (ref 4.22–5.81)
RDW: 12.8 % (ref 11.5–15.5)
WBC: 4.9 10*3/uL (ref 4.0–10.5)
nRBC: 0 % (ref 0.0–0.2)

## 2019-02-25 MED ORDER — SODIUM CHLORIDE 0.9 % IV BOLUS
1000.0000 mL | Freq: Once | INTRAVENOUS | Status: AC
  Administered 2019-02-25: 14:00:00 1000 mL via INTRAVENOUS

## 2019-02-25 NOTE — ED Provider Notes (Signed)
[facility] EMERGENCY DEPARTMENT Provider Note   CSN: [id number] Arrival date & time: 02/25/19  1306     History Chief Complaint  Patient presents with  . Hypotension    [name] is a 53 y.o. male.  HPI    Patient presents with lightheadedness, hypotension Patient has multiple medical issues, including hypertension. He notes that he has had increase in his antihypertensives within the past few months, but has been taking only 50% of his prescribed ARB due to concern for hypotension. He also notes that over the past month he has lost substantial amounts of weight. Today, the patient was particularly lightheaded, with mild disequilibrium without focal weakness, without confusion, without near syncope or any chest pain. He took his blood pressure, found to be 70 systolic, and presents for evaluation peer He denies other recent illness, beyond recent evaluation for peptic ulcer disease with endoscopy this fall.  Past Medical History:  Diagnosis Date  . Adenomatous colon polyp   . Anal fissure   . Arthritis   . Chest pain   . Depression   . Helicobacter pylori (H. pylori)   . Hemorrhoids   . Hyperlipidemia   . Hypertension   . Peptic ulcer, unspecified site, unspecified as acute or chronic, without mention of hemorrhage, perforation, or obstruction   . Pneumonia   . Posttraumatic stress disorder \  . Renal calculus    hx of  . Rhabdomyolysis     Patient Active Problem List   Diagnosis Date Noted  . HYPERLIPIDEMIA 06/10/2008  . POST TRAUMATIC STRESS SYNDROME 06/10/2008  . DEPRESSION 06/10/2008  . HYPERTENSION 06/10/2008  . PEPTIC ULCER DISEASE 06/10/2008  . RHABDOMYOLYSIS 06/10/2008  . CHEST PAIN, HX OF 06/10/2008  . RENAL CALCULUS, HX OF 06/10/2008    Past Surgical History:  Procedure Laterality Date  . COLONOSCOPY  2015  . HEMORRHOID SURGERY    . NASAL SINUS SURGERY    . SHOULDER SURGERY Left    thermal cap  . SPHINCTEROTOMY          Family History  Problem Relation Age of Onset  . Alzheimer's disease Mother 6       died  . Hyperlipidemia Father 40       alive -and back probldems  . Colon polyps Father   . Thyroid cancer Father   . Aplastic anemia Father   . Hematuria Brother 56  . Diabetes Paternal Grandmother   . Colon polyps Paternal Grandmother   . Diabetes Maternal Grandmother   . Colon polyps Maternal Grandmother   . Colon cancer Neg Hx   . Esophageal cancer Neg Hx   . Rectal cancer Neg Hx   . Stomach cancer Neg Hx     Social History   Tobacco Use  . Smoking status: Never Smoker  . Smokeless tobacco: Current User    Types: Chew  . Tobacco comment: pt. chew 2 packs of tobacco a week  Substance Use Topics  . Alcohol use: Yes    Comment: 1-2 per month per patient  . Drug use: No    Home Medications Prior to Admission medications   Medication Sig Start Date End Date Taking? Authorizing Provider  amLODipine (NORVASC) 5 MG tablet Take 5 mg by mouth daily.    [provider]  buPROPion (WELLBUTRIN SR) 150 MG 12 hr tablet Take 150 mg by mouth daily.    [provider]  Cyanocobalamin (B-12) 2500 MCG TABS Take 2 tablets by mouth daily.  [provider]  Erythromycin 2 % ointment Apply topically 2 (two) times daily. For acne    [provider]  labetalol (NORMODYNE) 200 MG tablet Take 1 tablet (200 mg total) by mouth 2 (two) times daily. 09/05/14   Jerline Pain, MD  loratadine (CLARITIN) 10 MG tablet Take 10 mg by mouth daily.    [provider]  ondansetron (ZOFRAN-ODT) 8 MG disintegrating tablet 1 tablet as directed. 11/07/18   [provider]  oxyCODONE-acetaminophen (PERCOCET) 5-325 MG per tablet Take 1 tablet by mouth every 4 (four) hours as needed. For pain    [provider]  pantoprazole (PROTONIX) 40 MG tablet TAKE 1 TABLET BY MOUTH DAILY. (MUST MAKE DOCTORS APPT FOR MORE REFILLS) 09/05/14   Jerline Pain, MD  PRALUENT  150 MG/ML SOPN Inject 1 Dose into the skin every 14 (fourteen) days. 09/03/16   [provider]  Propylene Glycol (SYSTANE BALANCE OP) Apply 1 drop to eye as needed.    [provider]  telmisartan (MICARDIS) 80 MG tablet 0.5 tablets daily.  10/16/18   [provider]  tiZANidine (ZANAFLEX) 4 MG tablet 1 tablet 3 (three) times daily. 11/01/18   [provider]  triamcinolone (NASACORT ALLERGY 24HR) 55 MCG/ACT AERO nasal inhaler Place 2 sprays into the nose daily.    [provider]  ZETIA 10 MG tablet TAKE 1 TABLET (10 MG TOTAL) BY MOUTH DAILY. 01/18/14   Sherren Mocha, MD  zolpidem (AMBIEN) 5 MG tablet Take 1 tablet by mouth at bedtime. 08/05/16   [provider]    Allergies    Statins, Tricor [fenofibrate], Bio-statin [nystatin], Diphenhydramine hcl, Latex, and Citrucel [methylcellulose]  Review of Systems   Review of Systems  Constitutional:       Per HPI, otherwise negative  HENT:       Per HPI, otherwise negative  Respiratory:       Per HPI, otherwise negative  Cardiovascular:       Per HPI, otherwise negative  Gastrointestinal: Negative for vomiting.  Endocrine:       Negative aside from HPI  Genitourinary:       Neg aside from HPI   Musculoskeletal:       Per HPI, otherwise negative  Skin: Negative.   Neurological: Positive for light-headedness. Negative for syncope.    Physical Exam Updated Vital Signs BP 119/82   Pulse 60   Temp 98.2 F (36.8 C) (Oral)   Resp 11   SpO2 97%   Physical Exam Vitals and nursing note reviewed.  Constitutional:      General: He is not in acute distress.    Appearance: He is well-developed.  HENT:     Head: Normocephalic and atraumatic.  Eyes:     Conjunctiva/sclera: Conjunctivae normal.  Cardiovascular:     Rate and Rhythm: Normal rate and regular rhythm.  Pulmonary:     Effort: Pulmonary effort is normal. No respiratory distress.     Breath sounds: No stridor.  Abdominal:      General: There is no distension.  Skin:    General: Skin is warm and dry.  Neurological:     General: No focal deficit present.     Mental Status: He is alert and oriented to person, place, and time.     ED Results / Procedures / Treatments   Labs (all labs ordered are listed, but only abnormal results are displayed) Labs Reviewed  BASIC METABOLIC PANEL - Abnormal; Notable for the  following components:      Result Value   Potassium 3.3 (*)    Glucose, Bld 127 (*)    All other components within normal limits  CBC WITH DIFFERENTIAL/PLATELET - Abnormal; Notable for the following components:   RBC 3.85 (*)    Hemoglobin 11.4 (*)    HCT 33.0 (*)    All other components within normal limits  POC SARS CORONAVIRUS 2 AG -  ED    EKG EKG Interpretation  Date/Time:  Sunday February 25 2019 13:12:51 EST Ventricular Rate:  61 PR Interval:    QRS Duration: 101 QT Interval:  502 QTC Calculation: 506 R Axis:   46 Text Interpretation: Sinus rhythm Prolonged QT interval Artifact Abnormal ECG Confirmed by Carmin Muskrat [phone] on 02/25/2019 1:15:46 PM   Radiology No results found.  Procedures Procedures (including critical care time)  Medications Ordered in ED Medications  sodium chloride 0.9 % bolus 1,000 mL (1,000 mLs Intravenous New Bag/Given 02/25/19 1345)    ED Course  I have reviewed the triage vital signs and the nursing notes.  Pertinent labs & imaging results that were available during my care of the patient were reviewed by me and considered in my medical decision making (see chart for details).    MDM Rules/Calculators/A&P                     Patient hypotensive on initial evaluation with a systolic of [id number].  Patient will receive fluids, labs, EKG. 3:47 PM Patient's heart rate reduced, blood pressure continues to improve, now after 1 L fluid resuscitation.  He describes some dysesthesia on the left side, but on exam has no focal neurologic deficiencies, he is  awake, alert, speaking clearly. Patient will require additional evaluation after completing fluid resuscitation, and review of pending x-ray, Covid test. Dr. Maryan Rued is aware of the patient. Final Clinical Impression(s) / ED Diagnoses Final diagnoses:  Hypotension, unspecified hypotension type     Carmin Muskrat, MD 02/25/19 [phone]

## 2019-02-25 NOTE — ED Triage Notes (Signed)
Pt states he took his normal BP meds at 11am and 1 hour later began to feel "foggy" pt took his bp while lying and it was [id number] systolic and when stood up was 80 systolic. Pt denies any pain. Pt is currently lying flat with BP 113/71 does feel light headed.

## 2019-02-25 NOTE — Discharge Instructions (Signed)
Make sure you are staying well hydrated and go down to the 20mg  of your micardis

## 2019-02-25 NOTE — ED Notes (Signed)
Pt pushed call bell to state he feels "werid and dizzy" pt currently is lying flat in bed with eyes closed. Current BP while lying is 96/57.

## 2019-02-25 NOTE — ED Notes (Signed)
ED Provider at bedside. 

## 2019-02-25 NOTE — ED Provider Notes (Signed)
Patient is feeling much better after fluids and hypotension has resolved.  Orthostatics are reassuring.  Patient will be discharged home.  He will communicate with his doctor and most likely needs medication adjustment.  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  This is most likely related to the fact that he is lost 26 pounds and has not been able to eat well due to maxillofacial surgery.   Blanchie Dessert, MD 02/25/19 1739

## 2019-02-26 MED FILL — SODIUM FLUORIDE 5000 PPM 1.: 1.1 | 30 days supply | Qty: 100 | Fill #1

## 2019-02-26 MED FILL — PRALUENT 150 MG/ML PEN: 150 | 84 days supply | Qty: 6 | Fill #1

## 2019-02-28 MED FILL — ZOLPIDEM TARTRATE 5 MG TAB: 5 | 30 days supply | Qty: 30 | Fill #0

## 2019-03-05 MED FILL — tiZANidine HCL 4 MG TABS: 4 | 30 days supply | Qty: 90 | Fill #1

## 2019-03-19 MED FILL — OXYCODONE-APAP 5-325MG: 5-325 | 22 days supply | Qty: 90 | Fill #0

## 2019-04-02 MED FILL — tiZANidine HCL 4 MG TABS: 4 | 30 days supply | Qty: 90 | Fill #2

## 2019-04-03 MED FILL — ZOLPIDEM TARTRATE 5 MG TAB: 5 | 30 days supply | Qty: 30 | Fill #1

## 2019-04-05 DIAGNOSIS — Z0000 Encounter for general adult medical examination without abnormal findings: Secondary | ICD-10-CM | POA: Diagnosis not present

## 2019-04-05 DIAGNOSIS — E7849 Other hyperlipidemia: Secondary | ICD-10-CM | POA: Diagnosis not present

## 2019-04-05 DIAGNOSIS — R7301 Impaired fasting glucose: Secondary | ICD-10-CM | POA: Diagnosis not present

## 2019-04-05 DIAGNOSIS — E538 Deficiency of other specified B group vitamins: Secondary | ICD-10-CM | POA: Diagnosis not present

## 2019-04-05 DIAGNOSIS — Z125 Encounter for screening for malignant neoplasm of prostate: Secondary | ICD-10-CM | POA: Diagnosis not present

## 2019-04-09 DIAGNOSIS — R82998 Other abnormal findings in urine: Secondary | ICD-10-CM | POA: Diagnosis not present

## 2019-04-12 DIAGNOSIS — I10 Essential (primary) hypertension: Secondary | ICD-10-CM | POA: Diagnosis not present

## 2019-04-12 DIAGNOSIS — M1990 Unspecified osteoarthritis, unspecified site: Secondary | ICD-10-CM | POA: Diagnosis not present

## 2019-04-12 DIAGNOSIS — R0789 Other chest pain: Secondary | ICD-10-CM | POA: Diagnosis not present

## 2019-04-12 DIAGNOSIS — M549 Dorsalgia, unspecified: Secondary | ICD-10-CM | POA: Diagnosis not present

## 2019-04-12 DIAGNOSIS — F329 Major depressive disorder, single episode, unspecified: Secondary | ICD-10-CM | POA: Diagnosis not present

## 2019-04-12 DIAGNOSIS — Z0000 Encounter for general adult medical examination without abnormal findings: Secondary | ICD-10-CM | POA: Diagnosis not present

## 2019-04-12 DIAGNOSIS — Z1331 Encounter for screening for depression: Secondary | ICD-10-CM | POA: Diagnosis not present

## 2019-04-12 DIAGNOSIS — R7301 Impaired fasting glucose: Secondary | ICD-10-CM | POA: Diagnosis not present

## 2019-04-12 DIAGNOSIS — K5900 Constipation, unspecified: Secondary | ICD-10-CM | POA: Diagnosis not present

## 2019-04-12 DIAGNOSIS — E785 Hyperlipidemia, unspecified: Secondary | ICD-10-CM | POA: Diagnosis not present

## 2019-04-12 MED FILL — TELMISARTAN 40 MG TABLET: 40 | 90 days supply | Qty: 90 | Fill #0

## 2019-04-16 DIAGNOSIS — Z1212 Encounter for screening for malignant neoplasm of rectum: Secondary | ICD-10-CM | POA: Diagnosis not present

## 2019-04-19 MED FILL — OXYCODONE-APAP 5-325MG: 5-325 | 22 days supply | Qty: 90 | Fill #0

## 2019-04-25 DIAGNOSIS — H35013 Changes in retinal vascular appearance, bilateral: Secondary | ICD-10-CM | POA: Diagnosis not present

## 2019-04-25 DIAGNOSIS — H524 Presbyopia: Secondary | ICD-10-CM | POA: Diagnosis not present

## 2019-04-25 DIAGNOSIS — H16223 Keratoconjunctivitis sicca, not specified as Sjogren's, bilateral: Secondary | ICD-10-CM | POA: Diagnosis not present

## 2019-04-25 DIAGNOSIS — H40013 Open angle with borderline findings, low risk, bilateral: Secondary | ICD-10-CM | POA: Diagnosis not present

## 2019-05-01 DIAGNOSIS — M5416 Radiculopathy, lumbar region: Secondary | ICD-10-CM | POA: Diagnosis not present

## 2019-05-01 DIAGNOSIS — M47816 Spondylosis without myelopathy or radiculopathy, lumbar region: Secondary | ICD-10-CM | POA: Diagnosis not present

## 2019-05-01 DIAGNOSIS — G5701 Lesion of sciatic nerve, right lower limb: Secondary | ICD-10-CM | POA: Diagnosis not present

## 2019-05-01 MED FILL — EZETIMIBE 10 MG TABS: 10 | 90 days supply | Qty: 90 | Fill #3

## 2019-05-01 MED FILL — AMLODIPINE BESYLATE 5 MG TA: 5 | 90 days supply | Qty: 90 | Fill #3

## 2019-05-01 MED FILL — LABETALOL HCL 200 MG TABS: 200 | 90 days supply | Qty: 180 | Fill #3

## 2019-05-01 MED FILL — BUPROPION HCL SR 150 MG TAB: 150 | 90 days supply | Qty: 90 | Fill #3

## 2019-05-01 MED FILL — ZOLPIDEM TARTRATE 5 MG TAB: 5 | 30 days supply | Qty: 30 | Fill #2

## 2019-05-01 MED FILL — tiZANidine HCL 4 MG TABS: 4 | 30 days supply | Qty: 90 | Fill #0

## 2019-05-14 MED FILL — PRALUENT 150 MG/ML SOAJ: 150 | 84 days supply | Qty: 6 | Fill #2

## 2019-05-14 MED FILL — PANTOPRAZOLE SOD DR 40 MG T: 40 | 90 days supply | Qty: 90 | Fill #3

## 2019-05-16 MED FILL — OXYCODONE-ACETAMINOPHEN 5-3: 5-325 | 22 days supply | Qty: 90 | Fill #0

## 2019-05-26 MED FILL — tiZANidine HCL 4 MG TABS: 4 | 30 days supply | Qty: 90 | Fill #1

## 2019-05-29 MED FILL — ZOLPIDEM TARTRATE 5 MG TAB: 5 | 30 days supply | Qty: 30 | Fill #3

## 2019-06-15 MED FILL — OXYCODONE-APAP 5-325MG: 5-325 | 22 days supply | Qty: 90 | Fill #0

## 2019-06-21 DIAGNOSIS — Z23 Encounter for immunization: Secondary | ICD-10-CM | POA: Diagnosis not present

## 2019-06-23 MED FILL — tiZANidine HCL 4 MG TABS: 4 | 30 days supply | Qty: 90 | Fill #2

## 2019-06-27 MED FILL — ZOLPIDEM TARTRATE 5 MG TAB: 5 | 30 days supply | Qty: 30 | Fill #4

## 2019-07-06 MED FILL — TELMISARTAN 40 MG TABLET: 40 | 90 days supply | Qty: 90 | Fill #1

## 2019-07-14 MED FILL — OXYCODONE-APAP 5-325MG: 5-325 | 22 days supply | Qty: 90 | Fill #0

## 2019-07-26 DIAGNOSIS — M5416 Radiculopathy, lumbar region: Secondary | ICD-10-CM | POA: Diagnosis not present

## 2019-07-26 DIAGNOSIS — G5701 Lesion of sciatic nerve, right lower limb: Secondary | ICD-10-CM | POA: Diagnosis not present

## 2019-07-26 DIAGNOSIS — M47816 Spondylosis without myelopathy or radiculopathy, lumbar region: Secondary | ICD-10-CM | POA: Diagnosis not present

## 2019-07-26 MED FILL — ZOLPIDEM TARTRATE 5 MG TAB: 5 | 30 days supply | Qty: 30 | Fill #5

## 2019-07-26 MED FILL — tiZANidine HCL 4 MG TABS: 4 | 30 days supply | Qty: 90 | Fill #0

## 2019-08-03 MED FILL — PRALUENT 150 MG/ML SOAJ: 150 | 84 days supply | Qty: 6 | Fill #3

## 2019-08-10 ENCOUNTER — Other Ambulatory Visit (HOSPITAL_COMMUNITY): Payer: Self-pay | Admitting: Internal Medicine

## 2019-08-10 MED FILL — BUPROPION HCL SR 150 MG TAB: 150 | 90 days supply | Qty: 90 | Fill #0

## 2019-08-10 MED FILL — PANTOPRAZOLE SOD DR 40 MG T: 40 | 90 days supply | Qty: 90 | Fill #0

## 2019-08-10 MED FILL — EZETIMIBE 10 MG TABS: 10 | 90 days supply | Qty: 90 | Fill #0

## 2019-08-10 MED FILL — AMLODIPINE BESYLATE 5 MG TA: 5 | 90 days supply | Qty: 90 | Fill #0

## 2019-08-10 MED FILL — LABETALOL HCL 200 MG TABS: 200 | 90 days supply | Qty: 180 | Fill #0

## 2019-08-20 MED FILL — tiZANidine HCL 4 MG TABS: 4 | 30 days supply | Qty: 90 | Fill #1

## 2019-08-21 MED FILL — ZOLPIDEM TARTRATE 5 MG TAB: 5 | 30 days supply | Qty: 30 | Fill #0

## 2019-09-13 MED FILL — tiZANidine HCL 4 MG TABS: 4 | 30 days supply | Qty: 90 | Fill #2

## 2019-09-13 MED FILL — OXYCODONE-APAP 5-325MG: 5-325 | 22 days supply | Qty: 90 | Fill #0

## 2019-09-18 MED FILL — ZOLPIDEM TARTRATE 5 MG TAB: 5 | 30 days supply | Qty: 30 | Fill #0

## 2019-09-28 MED FILL — TELMISARTAN 20 MG TABLET: 20 | 30 days supply | Qty: 60 | Fill #0

## 2019-10-13 MED FILL — TELMISARTAN 20 MG TABS: 20 | 30 days supply | Qty: 60 | Fill #0

## 2019-10-13 MED FILL — OXYCODONE-APAP 5-325MG: 5-325 | 22 days supply | Qty: 90 | Fill #0

## 2019-10-17 MED FILL — ZOLPIDEM TARTRATE 5 MG TAB: 5 | 30 days supply | Qty: 30 | Fill #1

## 2019-10-24 ENCOUNTER — Other Ambulatory Visit (HOSPITAL_COMMUNITY): Payer: Self-pay | Admitting: Neurosurgery

## 2019-10-24 DIAGNOSIS — M47816 Spondylosis without myelopathy or radiculopathy, lumbar region: Secondary | ICD-10-CM | POA: Diagnosis not present

## 2019-10-24 DIAGNOSIS — M5416 Radiculopathy, lumbar region: Secondary | ICD-10-CM | POA: Diagnosis not present

## 2019-10-24 DIAGNOSIS — G5701 Lesion of sciatic nerve, right lower limb: Secondary | ICD-10-CM | POA: Diagnosis not present

## 2019-10-24 MED FILL — tiZANidine HCL 4 MG TABS: 4 | 30 days supply | Qty: 90 | Fill #0

## 2019-11-01 MED FILL — BUPROPION HCL SR 150 MG TAB: 150 | 90 days supply | Qty: 90 | Fill #1

## 2019-11-01 MED FILL — LABETALOL HCL 200 MG TABS: 200 | 90 days supply | Qty: 180 | Fill #1

## 2019-11-01 MED FILL — PANTOPRAZOLE SOD DR 40 MG T: 40 | 90 days supply | Qty: 90 | Fill #1

## 2019-11-01 MED FILL — AMLODIPINE BESYLATE 5 MG TA: 5 | 90 days supply | Qty: 90 | Fill #1

## 2019-11-02 MED FILL — EZETIMIBE 10 MG TABS: 10 | 90 days supply | Qty: 90 | Fill #1

## 2019-11-13 ENCOUNTER — Other Ambulatory Visit (HOSPITAL_COMMUNITY): Payer: Self-pay | Admitting: Internal Medicine

## 2019-11-13 MED FILL — ZOLPIDEM TARTRATE 5 MG TABS: 5 | 30 days supply | Qty: 30 | Fill #2

## 2019-11-13 MED FILL — TELMISARTAN 20 MG TABS: 20 | 30 days supply | Qty: 60 | Fill #0

## 2019-11-13 MED FILL — OXYCODONE-APAP 5-325MG: 5-325 | 22 days supply | Qty: 90 | Fill #0

## 2019-11-17 MED FILL — tiZANidine HCL 4 MG TABS: 4 | 30 days supply | Qty: 90 | Fill #1

## 2019-11-18 ENCOUNTER — Other Ambulatory Visit (HOSPITAL_COMMUNITY): Payer: Self-pay | Admitting: Internal Medicine

## 2019-11-19 MED FILL — PRALUENT 150 MG/ML SOAJ: 150 | 84 days supply | Qty: 6 | Fill #0

## 2019-12-07 DIAGNOSIS — J069 Acute upper respiratory infection, unspecified: Secondary | ICD-10-CM | POA: Diagnosis not present

## 2019-12-07 DIAGNOSIS — R509 Fever, unspecified: Secondary | ICD-10-CM | POA: Diagnosis not present

## 2019-12-07 DIAGNOSIS — J029 Acute pharyngitis, unspecified: Secondary | ICD-10-CM | POA: Diagnosis not present

## 2019-12-07 MED FILL — TELMISARTAN 20 MG TABS: 20 | 30 days supply | Qty: 60 | Fill #1

## 2019-12-11 MED FILL — ZOLPIDEM TARTRATE 5 MG TABS: 5 | 30 days supply | Qty: 30 | Fill #3

## 2019-12-11 MED FILL — OXYCODONE-APAP 5-325MG: 5-325 | 22 days supply | Qty: 90 | Fill #0

## 2019-12-13 MED FILL — tiZANidine HCL 4 MG TABS: 4 | 30 days supply | Qty: 90 | Fill #2

## 2020-01-03 MED FILL — TELMISARTAN 20 MG TABS: 20 | 30 days supply | Qty: 60 | Fill #2

## 2020-01-11 MED FILL — ZOLPIDEM TARTRATE 5 MG TABS: 5 | 30 days supply | Qty: 30 | Fill #4

## 2020-01-11 MED FILL — OXYCODONE-APAP 5-325MG: 5-325 | 22 days supply | Qty: 90 | Fill #0

## 2020-01-15 ENCOUNTER — Ambulatory Visit: Payer: 59 | Attending: Internal Medicine

## 2020-01-15 ENCOUNTER — Other Ambulatory Visit (HOSPITAL_BASED_OUTPATIENT_CLINIC_OR_DEPARTMENT_OTHER): Payer: Self-pay | Admitting: Internal Medicine

## 2020-01-15 DIAGNOSIS — Z23 Encounter for immunization: Secondary | ICD-10-CM

## 2020-01-18 MED FILL — PFIZER-BIONTECH COVID-19 VA: 30 | 1 days supply | Qty: 0 | Fill #0

## 2020-01-24 ENCOUNTER — Other Ambulatory Visit (HOSPITAL_COMMUNITY): Payer: Self-pay | Admitting: Neurosurgery

## 2020-01-24 DIAGNOSIS — M25521 Pain in right elbow: Secondary | ICD-10-CM | POA: Diagnosis not present

## 2020-01-24 DIAGNOSIS — M47816 Spondylosis without myelopathy or radiculopathy, lumbar region: Secondary | ICD-10-CM | POA: Diagnosis not present

## 2020-01-24 DIAGNOSIS — M5416 Radiculopathy, lumbar region: Secondary | ICD-10-CM | POA: Diagnosis not present

## 2020-01-24 DIAGNOSIS — M7711 Lateral epicondylitis, right elbow: Secondary | ICD-10-CM | POA: Diagnosis not present

## 2020-01-24 MED FILL — tiZANidine HCL 4 MG TABS: 4 | 30 days supply | Qty: 90 | Fill #0

## 2020-01-28 MED FILL — EZETIMIBE 10 MG TABS: 10 | 90 days supply | Qty: 90 | Fill #2

## 2020-01-28 MED FILL — BUPROPION HCL SR 150 MG TAB: 150 | 90 days supply | Qty: 90 | Fill #2

## 2020-01-28 MED FILL — TELMISARTAN 20 MG TABS: 20 | 30 days supply | Qty: 60 | Fill #3

## 2020-02-01 ENCOUNTER — Other Ambulatory Visit (HOSPITAL_COMMUNITY): Payer: Self-pay | Admitting: Internal Medicine

## 2020-02-01 MED FILL — AMLODIPINE BESYLATE 5 MG TA: 5 | 90 days supply | Qty: 90 | Fill #2

## 2020-02-01 MED FILL — PANTOPRAZOLE SOD DR 40 MG T: 40 | 90 days supply | Qty: 90 | Fill #2

## 2020-02-01 MED FILL — LABETALOL HCL 200 MG TABLET: 200 | 90 days supply | Qty: 180 | Fill #2

## 2020-02-05 MED FILL — PRALUENT 150 MG/ML SOAJ: 150 | 84 days supply | Qty: 6 | Fill #1

## 2020-02-08 ENCOUNTER — Other Ambulatory Visit (HOSPITAL_COMMUNITY): Payer: Self-pay | Admitting: Internal Medicine

## 2020-02-08 MED FILL — OXYCODONE-APAP 5-325MG: 5-325 | 22 days supply | Qty: 90 | Fill #0

## 2020-02-08 MED FILL — ZOLPIDEM TARTRATE 5 MG TABS: 5 | 30 days supply | Qty: 30 | Fill #0

## 2020-02-20 DIAGNOSIS — M7711 Lateral epicondylitis, right elbow: Secondary | ICD-10-CM | POA: Diagnosis not present

## 2020-02-21 MED FILL — tiZANidine HCL 4 MG TABS: 4 | 90 days supply | Qty: 270 | Fill #0

## 2020-02-22 MED FILL — TELMISARTAN 20 MG TABS: 20 | 30 days supply | Qty: 60 | Fill #4

## 2020-03-04 DIAGNOSIS — H90A31 Mixed conductive and sensorineural hearing loss, unilateral, right ear with restricted hearing on the contralateral side: Secondary | ICD-10-CM | POA: Diagnosis not present

## 2020-03-04 DIAGNOSIS — H90A22 Sensorineural hearing loss, unilateral, left ear, with restricted hearing on the contralateral side: Secondary | ICD-10-CM | POA: Diagnosis not present

## 2020-03-04 DIAGNOSIS — Z570 Occupational exposure to noise: Secondary | ICD-10-CM | POA: Diagnosis not present

## 2020-03-07 MED FILL — ZOLPIDEM TARTRATE 5 MG TABS: 5 | 30 days supply | Qty: 30 | Fill #1

## 2020-03-10 MED FILL — OXYCODONE-APAP 5-325MG: 5-325 | 22 days supply | Qty: 90 | Fill #0

## 2020-03-19 MED FILL — TELMISARTAN 20 MG TABS: 20 | 30 days supply | Qty: 60 | Fill #5

## 2020-03-26 DIAGNOSIS — H903 Sensorineural hearing loss, bilateral: Secondary | ICD-10-CM | POA: Diagnosis not present

## 2020-03-26 DIAGNOSIS — H6122 Impacted cerumen, left ear: Secondary | ICD-10-CM | POA: Diagnosis not present

## 2020-03-27 ENCOUNTER — Other Ambulatory Visit: Payer: Self-pay | Admitting: Otolaryngology

## 2020-03-27 ENCOUNTER — Other Ambulatory Visit (HOSPITAL_COMMUNITY): Payer: Self-pay | Admitting: Otolaryngology

## 2020-03-27 DIAGNOSIS — H918X9 Other specified hearing loss, unspecified ear: Secondary | ICD-10-CM

## 2020-04-02 DIAGNOSIS — M7711 Lateral epicondylitis, right elbow: Secondary | ICD-10-CM | POA: Diagnosis not present

## 2020-04-07 MED FILL — ZOLPIDEM TARTRATE 5 MG TABS: 5 | 30 days supply | Qty: 30 | Fill #2

## 2020-04-09 MED FILL — OXYCODONE-APAP 5-325MG: 5-325 | 22 days supply | Qty: 90 | Fill #0

## 2020-04-10 ENCOUNTER — Ambulatory Visit
Admission: RE | Admit: 2020-04-10 | Discharge: 2020-04-10 | Disposition: A | Discharge status: Home / Self Care | Payer: 59 | Source: Ambulatory Visit | Attending: Otolaryngology | Admitting: Otolaryngology

## 2020-04-10 ENCOUNTER — Other Ambulatory Visit: Payer: Self-pay

## 2020-04-10 DIAGNOSIS — H9192 Unspecified hearing loss, left ear: Secondary | ICD-10-CM | POA: Diagnosis not present

## 2020-04-10 DIAGNOSIS — H918X9 Other specified hearing loss, unspecified ear: Secondary | ICD-10-CM

## 2020-04-10 MED ORDER — GADOBENATE DIMEGLUMINE 529 MG/ML IV SOLN
17.0000 mL | Freq: Once | INTRAVENOUS | Status: AC | PRN
  Administered 2020-04-10: 17 mL via INTRAVENOUS

## 2020-04-15 ENCOUNTER — Other Ambulatory Visit (HOSPITAL_COMMUNITY): Payer: Self-pay | Admitting: Neurosurgery

## 2020-04-17 ENCOUNTER — Encounter: Payer: Self-pay | Admitting: Gastroenterology

## 2020-04-18 DIAGNOSIS — Z0000 Encounter for general adult medical examination without abnormal findings: Secondary | ICD-10-CM | POA: Diagnosis not present

## 2020-04-18 DIAGNOSIS — E538 Deficiency of other specified B group vitamins: Secondary | ICD-10-CM | POA: Diagnosis not present

## 2020-04-18 DIAGNOSIS — Z125 Encounter for screening for malignant neoplasm of prostate: Secondary | ICD-10-CM | POA: Diagnosis not present

## 2020-04-18 DIAGNOSIS — R7301 Impaired fasting glucose: Secondary | ICD-10-CM | POA: Diagnosis not present

## 2020-04-18 DIAGNOSIS — E785 Hyperlipidemia, unspecified: Secondary | ICD-10-CM | POA: Diagnosis not present

## 2020-04-22 DIAGNOSIS — R8290 Unspecified abnormal findings in urine: Secondary | ICD-10-CM | POA: Diagnosis not present

## 2020-04-24 MED FILL — PRALUENT 150 MG/ML SOAJ: 150 | 84 days supply | Qty: 6 | Fill #2

## 2020-04-25 ENCOUNTER — Encounter: Payer: Self-pay | Admitting: Gastroenterology

## 2020-04-25 ENCOUNTER — Other Ambulatory Visit (HOSPITAL_COMMUNITY): Payer: Self-pay | Admitting: Internal Medicine

## 2020-04-25 DIAGNOSIS — I10 Essential (primary) hypertension: Secondary | ICD-10-CM | POA: Diagnosis not present

## 2020-04-25 DIAGNOSIS — Z1331 Encounter for screening for depression: Secondary | ICD-10-CM | POA: Diagnosis not present

## 2020-04-25 DIAGNOSIS — I700 Atherosclerosis of aorta: Secondary | ICD-10-CM | POA: Diagnosis not present

## 2020-04-25 DIAGNOSIS — Z1212 Encounter for screening for malignant neoplasm of rectum: Secondary | ICD-10-CM | POA: Diagnosis not present

## 2020-04-25 DIAGNOSIS — J309 Allergic rhinitis, unspecified: Secondary | ICD-10-CM | POA: Diagnosis not present

## 2020-04-25 DIAGNOSIS — Z0000 Encounter for general adult medical examination without abnormal findings: Secondary | ICD-10-CM | POA: Diagnosis not present

## 2020-04-25 DIAGNOSIS — E538 Deficiency of other specified B group vitamins: Secondary | ICD-10-CM | POA: Diagnosis not present

## 2020-04-25 DIAGNOSIS — K219 Gastro-esophageal reflux disease without esophagitis: Secondary | ICD-10-CM | POA: Diagnosis not present

## 2020-04-25 DIAGNOSIS — Z23 Encounter for immunization: Secondary | ICD-10-CM | POA: Diagnosis not present

## 2020-04-25 DIAGNOSIS — R7301 Impaired fasting glucose: Secondary | ICD-10-CM | POA: Diagnosis not present

## 2020-04-25 DIAGNOSIS — E785 Hyperlipidemia, unspecified: Secondary | ICD-10-CM | POA: Diagnosis not present

## 2020-04-25 DIAGNOSIS — F329 Major depressive disorder, single episode, unspecified: Secondary | ICD-10-CM | POA: Diagnosis not present

## 2020-04-25 MED FILL — ONDANSETRON ODT 8 MG TABLET: 8 | 6 days supply | Qty: 20 | Fill #0

## 2020-04-25 MED FILL — AMLODIPINE BESYLATE 5 MG TA: 5 | 90 days supply | Qty: 90 | Fill #0

## 2020-04-25 MED FILL — BUPROPION HCL SR 150 MG TAB: 150 | 90 days supply | Qty: 90 | Fill #0

## 2020-04-25 MED FILL — LABETALOL HCL 200 MG TABLET: 200 | 90 days supply | Qty: 180 | Fill #0

## 2020-04-25 MED FILL — TELMISARTAN 20 MG TABS: 20 | 90 days supply | Qty: 180 | Fill #0

## 2020-04-25 MED FILL — EZETIMIBE 10 MG TABS: 10 | 90 days supply | Qty: 90 | Fill #0

## 2020-04-25 MED FILL — ZOLPIDEM TARTRATE 5 MG TABS: 5 | 30 days supply | Qty: 30 | Fill #3

## 2020-04-25 MED FILL — PANTOPRAZOLE SOD DR 40 MG T: 40 | 90 days supply | Qty: 90 | Fill #0

## 2020-04-25 MED FILL — tiZANidine HCL 4 MG TABS: 4 | 90 days supply | Qty: 270 | Fill #1

## 2020-04-28 MED FILL — OXYCODONE-APAP 5-325MG: 5-325 | 22 days supply | Qty: 90 | Fill #0

## 2020-05-20 ENCOUNTER — Other Ambulatory Visit (HOSPITAL_COMMUNITY): Payer: Self-pay | Admitting: Neurosurgery

## 2020-05-20 DIAGNOSIS — G5701 Lesion of sciatic nerve, right lower limb: Secondary | ICD-10-CM | POA: Diagnosis not present

## 2020-05-20 DIAGNOSIS — M5416 Radiculopathy, lumbar region: Secondary | ICD-10-CM | POA: Diagnosis not present

## 2020-05-20 DIAGNOSIS — M47816 Spondylosis without myelopathy or radiculopathy, lumbar region: Secondary | ICD-10-CM | POA: Diagnosis not present

## 2020-05-30 ENCOUNTER — Other Ambulatory Visit (HOSPITAL_BASED_OUTPATIENT_CLINIC_OR_DEPARTMENT_OTHER): Payer: Self-pay

## 2020-05-30 MED FILL — OXYCODONE-APAP 5-325MG: 5-325 | 30 days supply | Qty: 90 | Fill #0

## 2020-05-30 MED FILL — ZOLPIDEM TARTRATE 5 MG TABS: 5 | 30 days supply | Qty: 30 | Fill #4

## 2020-06-02 ENCOUNTER — Other Ambulatory Visit (HOSPITAL_BASED_OUTPATIENT_CLINIC_OR_DEPARTMENT_OTHER): Payer: Self-pay

## 2020-06-10 DIAGNOSIS — H903 Sensorineural hearing loss, bilateral: Secondary | ICD-10-CM | POA: Diagnosis not present

## 2020-06-17 ENCOUNTER — Other Ambulatory Visit (HOSPITAL_COMMUNITY): Payer: Self-pay

## 2020-06-17 MED FILL — Tizanidine HCl Tab 4 MG (Base Equivalent): ORAL | 30 days supply | Qty: 90 | Fill #0 | Status: CN

## 2020-06-18 ENCOUNTER — Other Ambulatory Visit (HOSPITAL_COMMUNITY): Payer: Self-pay

## 2020-06-25 ENCOUNTER — Ambulatory Visit (AMBULATORY_SURGERY_CENTER): Payer: Self-pay | Admitting: *Deleted

## 2020-06-25 ENCOUNTER — Other Ambulatory Visit: Payer: Self-pay

## 2020-06-25 ENCOUNTER — Other Ambulatory Visit (HOSPITAL_COMMUNITY): Payer: Self-pay

## 2020-06-25 VITALS — Ht 71.0 in | Wt 185.0 lb

## 2020-06-25 DIAGNOSIS — Z86010 Personal history of colonic polyps: Secondary | ICD-10-CM

## 2020-06-25 MED ORDER — NA SULFATE-K SULFATE-MG SULF 17.5-3.13-1.6 GM/177ML PO SOLN
ORAL | 0 refills | Status: DC
  Filled 2020-06-25: qty 354, 1d supply, fill #0

## 2020-06-25 NOTE — Progress Notes (Signed)
Patient is here in-person for PV. Patient denies any allergies to eggs or soy. Patient denies any problems with anesthesia/sedation. Patient denies any oxygen use at home. Patient denies taking any diet/weight loss medications or blood thinners. Patient is not being treated for MRSA or C-diff. Patient is aware of our care-partner policy and [id number] safety protocol. EMMI education assigned to the patient for the procedure, sent to [facility].   Patient is fully COVID-19 vaccinated, per patient.  Patient notified NOT to use chewing tobacco the day of procedure.

## 2020-07-02 ENCOUNTER — Other Ambulatory Visit (HOSPITAL_COMMUNITY): Payer: Self-pay | Admitting: Neurosurgery

## 2020-07-02 ENCOUNTER — Other Ambulatory Visit (HOSPITAL_COMMUNITY): Payer: Self-pay

## 2020-07-02 MED ORDER — OXYCODONE-ACETAMINOPHEN 5-325 MG PO TABS
1.0000 | ORAL_TABLET | ORAL | 0 refills | Status: DC
Start: 2020-05-20 — End: 2020-07-09
  Filled 2020-07-02: qty 90, 30d supply, fill #0

## 2020-07-02 MED ORDER — OXYCODONE-ACETAMINOPHEN 5-325 MG PO TABS
1.0000 | ORAL_TABLET | ORAL | 0 refills | Status: DC
  Filled 2020-07-25: qty 90, 22d supply, fill #0

## 2020-07-02 MED FILL — Zolpidem Tartrate Tab 5 MG: ORAL | 90 days supply | Qty: 90 | Fill #0 | Status: AC

## 2020-07-03 ENCOUNTER — Other Ambulatory Visit (HOSPITAL_COMMUNITY): Payer: Self-pay

## 2020-07-05 ENCOUNTER — Other Ambulatory Visit (HOSPITAL_COMMUNITY): Payer: Self-pay

## 2020-07-05 MED FILL — Telmisartan Tab 20 MG: ORAL | 30 days supply | Qty: 60 | Fill #0 | Status: AC

## 2020-07-05 MED FILL — Ezetimibe Tab 10 MG: ORAL | 90 days supply | Qty: 90 | Fill #0 | Status: AC

## 2020-07-05 MED FILL — Amlodipine Besylate Tab 5 MG (Base Equivalent): ORAL | 90 days supply | Qty: 90 | Fill #0 | Status: AC

## 2020-07-05 MED FILL — Bupropion HCl Tab ER 12HR 150 MG: ORAL | 90 days supply | Qty: 90 | Fill #0 | Status: AC

## 2020-07-05 MED FILL — Labetalol HCl Tab 200 MG: ORAL | 90 days supply | Qty: 180 | Fill #0 | Status: AC

## 2020-07-05 MED FILL — Pantoprazole Sodium EC Tab 40 MG (Base Equiv): ORAL | 90 days supply | Qty: 90 | Fill #0 | Status: AC

## 2020-07-09 ENCOUNTER — Other Ambulatory Visit: Payer: Self-pay

## 2020-07-09 ENCOUNTER — Encounter: Payer: Self-pay | Admitting: Gastroenterology

## 2020-07-09 ENCOUNTER — Ambulatory Visit (AMBULATORY_SURGERY_CENTER): Payer: 59 | Admitting: Gastroenterology

## 2020-07-09 VITALS — BP 141/96 | HR 57 | Temp 97.5°F | Resp 12 | Ht 71.0 in | Wt 185.0 lb

## 2020-07-09 DIAGNOSIS — Z860100 Personal history of colon polyps, unspecified: Secondary | ICD-10-CM

## 2020-07-09 DIAGNOSIS — D125 Benign neoplasm of sigmoid colon: Secondary | ICD-10-CM | POA: Diagnosis not present

## 2020-07-09 DIAGNOSIS — I10 Essential (primary) hypertension: Secondary | ICD-10-CM | POA: Diagnosis not present

## 2020-07-09 DIAGNOSIS — D124 Benign neoplasm of descending colon: Secondary | ICD-10-CM | POA: Diagnosis not present

## 2020-07-09 DIAGNOSIS — D122 Benign neoplasm of ascending colon: Secondary | ICD-10-CM | POA: Diagnosis not present

## 2020-07-09 DIAGNOSIS — D120 Benign neoplasm of cecum: Secondary | ICD-10-CM | POA: Diagnosis not present

## 2020-07-09 DIAGNOSIS — Z1211 Encounter for screening for malignant neoplasm of colon: Secondary | ICD-10-CM | POA: Diagnosis not present

## 2020-07-09 MED ORDER — SODIUM CHLORIDE 0.9 % IV SOLN: 500.0000 mL | Freq: Once | INTRAVENOUS | Status: DC

## 2020-07-09 NOTE — Progress Notes (Signed)
Called to room to assist during endoscopic procedure.  Patient ID and intended procedure confirmed with present staff. Received instructions for my participation in the procedure from the performing physician.  

## 2020-07-09 NOTE — Progress Notes (Signed)
Report given to PACU, vss 

## 2020-07-09 NOTE — Progress Notes (Signed)
Pt's states no medical or surgical changes since previsit or office visit. VS by SF 

## 2020-07-09 NOTE — Patient Instructions (Signed)
Please read handouts provided. Continue present medications. Await pathology results.   YOU HAD AN ENDOSCOPIC PROCEDURE TODAY AT THE [facility]:   Refer to the procedure report that was given to you for any specific questions about what was found during the examination.  If the procedure report does not answer your questions, please call your gastroenterologist to clarify.  If you requested that your care partner not be given the details of your procedure findings, then the procedure report has been included in a sealed envelope for you to review at your convenience later.  YOU SHOULD EXPECT: Some feelings of bloating in the abdomen. Passage of more gas than usual.  Walking can help get rid of the air that was put into your GI tract during the procedure and reduce the bloating. If you had a lower endoscopy (such as a colonoscopy or flexible sigmoidoscopy) you may notice spotting of blood in your stool or on the toilet paper. If you underwent a bowel prep for your procedure, you may not have a normal bowel movement for a few days.  Please Note:  You might notice some irritation and congestion in your nose or some drainage.  This is from the oxygen used during your procedure.  There is no need for concern and it should clear up in a day or so.  SYMPTOMS TO REPORT IMMEDIATELY:  Following lower endoscopy (colonoscopy or flexible sigmoidoscopy):  Excessive amounts of blood in the stool  Significant tenderness or worsening of abdominal pains  Swelling of the abdomen that is new, acute  Fever of 100F or higher   For urgent or emergent issues, a gastroenterologist can be reached at any hour by calling [phone]. Do not use MyChart messaging for urgent concerns.    DIET:  We do recommend a small meal at first, but then you may proceed to your regular diet.  Drink plenty of fluids but you should avoid alcoholic beverages for 24 hours.  ACTIVITY:  You should plan to take it easy  for the rest of today and you should NOT DRIVE or use heavy machinery until tomorrow (because of the sedation medicines used during the test).    FOLLOW UP: Our staff will call the number listed on your records 48-72 hours following your procedure to check on you and address any questions or concerns that you may have regarding the information given to you following your procedure. If we do not reach you, we will leave a message.  We will attempt to reach you two times.  During this call, we will ask if you have developed any symptoms of COVID 19. If you develop any symptoms (ie: fever, flu-like symptoms, shortness of breath, cough etc.) before then, please call [phone].  If you test positive for Covid 19 in the 2 weeks post procedure, please call and report this information to us.    If any biopsies were taken you will be contacted by phone or by letter within the next 1-3 weeks.  Please call us at [phone] if you have not heard about the biopsies in 3 weeks.    SIGNATURES/CONFIDENTIALITY: You and/or your care partner have signed paperwork which will be entered into your electronic medical record.  These signatures attest to the fact that that the information above on your After Visit Summary has been reviewed and is understood.  Full responsibility of the confidentiality of this discharge information lies with you and/or your care-partner.  

## 2020-07-09 NOTE — Op Note (Signed)
[facility] Patient Name: [name] Procedure Date: 07/09/2020 11:16 AM MRN: [id number] Endoscopist: Milus Banister , MD Age: 55 Referring MD:  Date of Birth: 30-Aug-1965 Gender: Male Account #: [id number] Procedure:                Colonoscopy Indications:              High risk colon cancer surveillance: Personal                            history of colonic polyps; 2009 colonoscopy 1 TA,                            2013 colonoscopy 3 TAs (all subCM), Colonoscopy                            2018 7 subCM adenomas Medicines:                Monitored Anesthesia Care Procedure:                Pre-Anesthesia Assessment:                           - Prior to the procedure, a History and Physical                            was performed, and patient medications and                            allergies were reviewed. The patient's tolerance of                            previous anesthesia was also reviewed. The risks                            and benefits of the procedure and the sedation                            options and risks were discussed with the patient.                            All questions were answered, and informed consent                            was obtained. Prior Anticoagulants: The patient has                            taken no previous anticoagulant or antiplatelet                            agents. ASA Grade Assessment: II - A patient with                            mild systemic disease. After reviewing the risks  and benefits, the patient was deemed in                            satisfactory condition to undergo the procedure.                           After obtaining informed consent, the colonoscope                            was passed under direct vision. Throughout the                            procedure, the patient's blood pressure, pulse, and                            oxygen saturations were monitored continuously.  The                            Olympus CF-[id number]) Colonoscope was                            introduced through the anus and advanced to the the                            cecum, identified by appendiceal orifice and                            ileocecal valve. The colonoscopy was performed                            without difficulty. The patient tolerated the                            procedure well. The quality of the bowel                            preparation was good. The ileocecal valve,                            appendiceal orifice, and rectum were photographed. Scope In: 11:18:26 AM Scope Out: 11:32:53 AM Scope Withdrawal Time: 0 hours 12 minutes 42 seconds  Total Procedure Duration: 0 hours 14 minutes 27 seconds  Findings:                 Nine sessile polyps were found in the sigmoid                            colon, descending colon, ascending colon and cecum.                            The polyps were 1 to 7 mm in size. These polyps                            were removed with a cold snare. Resection  and                            retrieval were complete.                           The exam was otherwise without abnormality on                            direct and retroflexion views. Complications:            No immediate complications. Estimated blood loss:                            None. Estimated Blood Loss:     Estimated blood loss: none. Impression:               - Nine 1 to 7 mm polyps in the sigmoid colon, in                            the descending colon, in the ascending colon and in                            the cecum, removed with a cold snare. Resected and                            retrieved.                           - The examination was otherwise normal on direct                            and retroflexion views. Recommendation:           - Patient has a contact number available for                            emergencies. The signs and symptoms  of potential                            delayed complications were discussed with the                            patient. Return to normal activities tomorrow.                            Written discharge instructions were provided to the                            patient.                           - Resume previous diet.                           - Continue present medications.                           -  Await pathology results. Milus Banister, MD 07/09/2020 11:36:20 AM This report has been signed electronically.

## 2020-07-11 ENCOUNTER — Telehealth: Payer: Self-pay

## 2020-07-11 NOTE — Telephone Encounter (Signed)
LVM

## 2020-07-16 ENCOUNTER — Other Ambulatory Visit (HOSPITAL_COMMUNITY): Payer: Self-pay

## 2020-07-16 MED FILL — Alirocumab Subcutaneous Solution Auto-Injector 150 MG/ML: SUBCUTANEOUS | 84 days supply | Qty: 6 | Fill #0 | Status: AC

## 2020-07-17 ENCOUNTER — Other Ambulatory Visit (HOSPITAL_COMMUNITY): Payer: Self-pay

## 2020-07-18 ENCOUNTER — Other Ambulatory Visit (HOSPITAL_COMMUNITY): Payer: Self-pay

## 2020-07-18 ENCOUNTER — Encounter: Payer: Self-pay | Admitting: Gastroenterology

## 2020-07-25 ENCOUNTER — Other Ambulatory Visit (HOSPITAL_COMMUNITY): Payer: Self-pay

## 2020-07-25 MED FILL — Zolpidem Tartrate Tab 5 MG: ORAL | 90 days supply | Qty: 90 | Fill #1 | Status: CN

## 2020-07-26 ENCOUNTER — Other Ambulatory Visit (HOSPITAL_COMMUNITY): Payer: Self-pay

## 2020-07-29 ENCOUNTER — Other Ambulatory Visit (HOSPITAL_COMMUNITY): Payer: Self-pay

## 2020-07-29 MED FILL — Tizanidine HCl Tab 4 MG (Base Equivalent): ORAL | 90 days supply | Qty: 270 | Fill #0 | Status: AC

## 2020-08-19 ENCOUNTER — Other Ambulatory Visit (HOSPITAL_COMMUNITY): Payer: Self-pay

## 2020-08-19 DIAGNOSIS — G5701 Lesion of sciatic nerve, right lower limb: Secondary | ICD-10-CM | POA: Diagnosis not present

## 2020-08-19 DIAGNOSIS — M5416 Radiculopathy, lumbar region: Secondary | ICD-10-CM | POA: Diagnosis not present

## 2020-08-19 MED ORDER — BACLOFEN 10 MG PO TABS
ORAL_TABLET | ORAL | 2 refills | Status: DC
  Filled 2020-08-19: qty 90, 30d supply, fill #0
  Filled 2020-09-17: qty 90, 30d supply, fill #1
  Filled 2020-11-18: qty 90, 30d supply, fill #2

## 2020-08-19 MED ORDER — OXYCODONE-ACETAMINOPHEN 5-325 MG PO TABS
ORAL_TABLET | ORAL | 0 refills | Status: DC
  Filled 2020-08-26: qty 90, 22d supply, fill #0

## 2020-08-19 MED ORDER — OXYCODONE-ACETAMINOPHEN 5-325 MG PO TABS
ORAL_TABLET | ORAL | 0 refills | Status: DC
  Filled 2020-10-28: qty 90, 30d supply, fill #0

## 2020-08-19 MED ORDER — OXYCODONE-ACETAMINOPHEN 5-325 MG PO TABS
ORAL_TABLET | ORAL | 0 refills | Status: DC
  Filled 2020-09-25: qty 90, 22d supply, fill #0

## 2020-08-26 ENCOUNTER — Other Ambulatory Visit (HOSPITAL_COMMUNITY): Payer: Self-pay

## 2020-09-11 DIAGNOSIS — H524 Presbyopia: Secondary | ICD-10-CM | POA: Diagnosis not present

## 2020-09-11 DIAGNOSIS — H16223 Keratoconjunctivitis sicca, not specified as Sjogren's, bilateral: Secondary | ICD-10-CM | POA: Diagnosis not present

## 2020-09-11 DIAGNOSIS — H35013 Changes in retinal vascular appearance, bilateral: Secondary | ICD-10-CM | POA: Diagnosis not present

## 2020-09-11 DIAGNOSIS — H40013 Open angle with borderline findings, low risk, bilateral: Secondary | ICD-10-CM | POA: Diagnosis not present

## 2020-09-15 IMAGING — US ULTRASOUND ABDOMEN LIMITED
1 series · 14 of 25 positions shown · non-contrast
Comparison: None.

CLINICAL DATA: Right upper quadrant pain with nausea and vomiting

EXAM:
ULTRASOUND ABDOMEN LIMITED RIGHT UPPER QUADRANT

[Series 1: ultrasound abdomen limited · 14 of 55 slices shown]
[im 1/55]
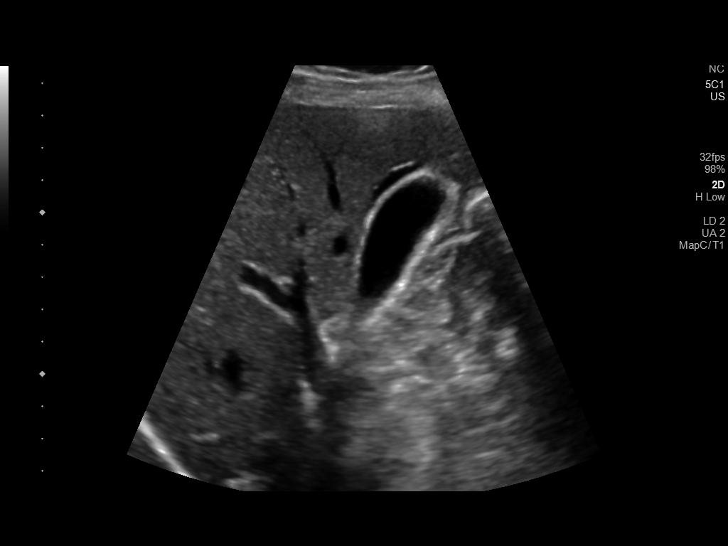
[im 5/55]
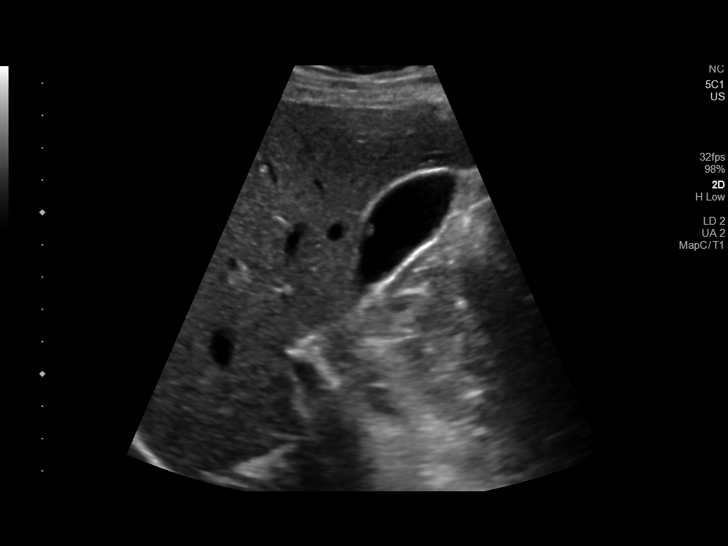
[im 10/55]
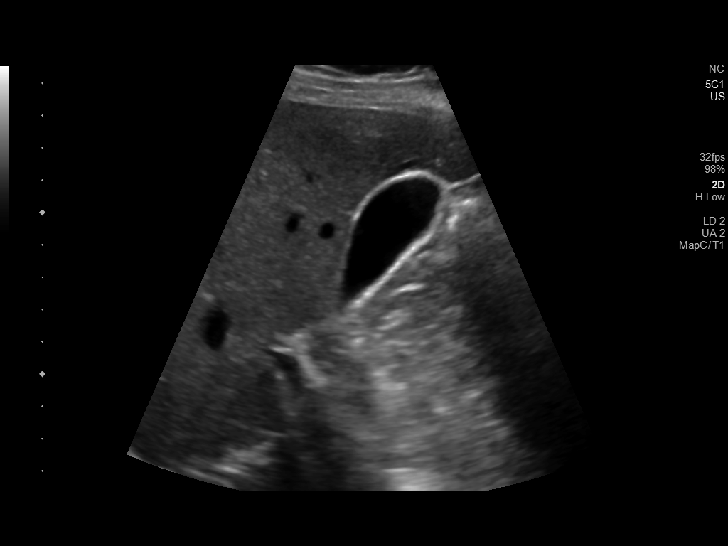
[im 14/55]
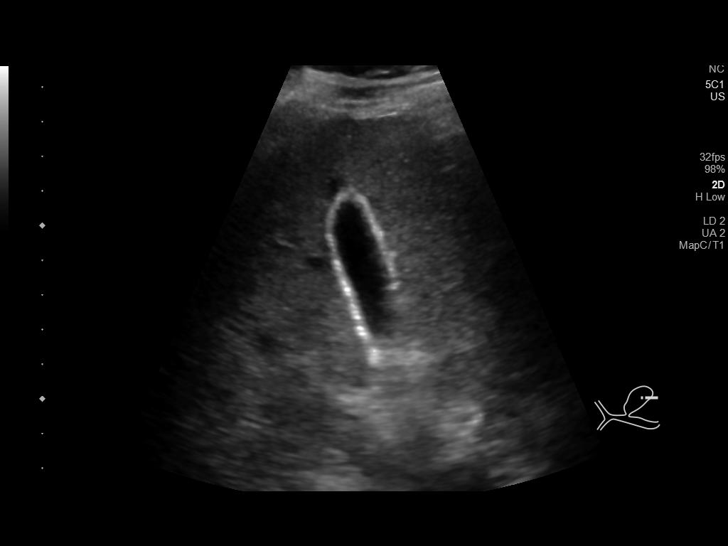
[im 19/55]
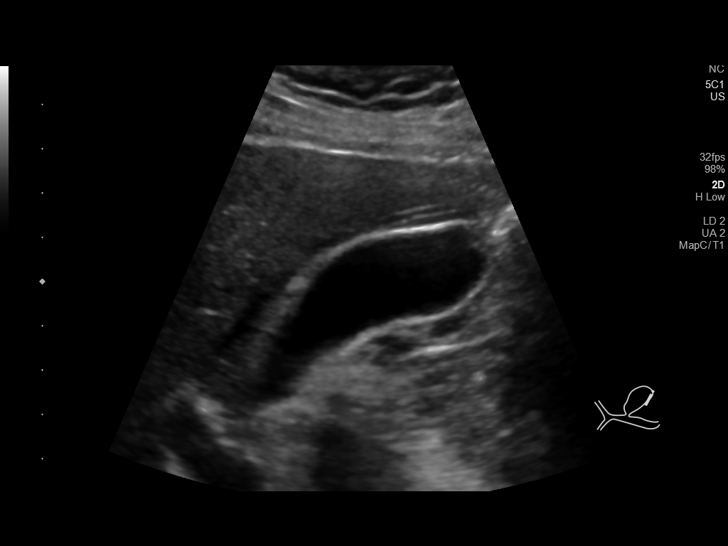
[im 21/55]
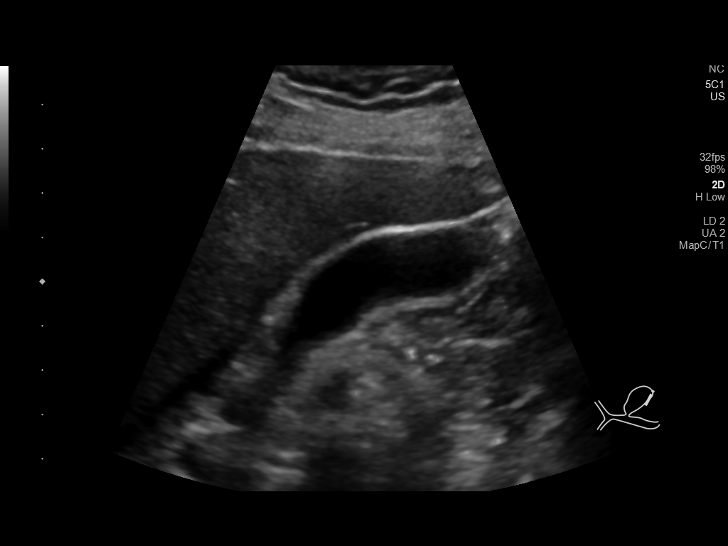
[im 25/55]
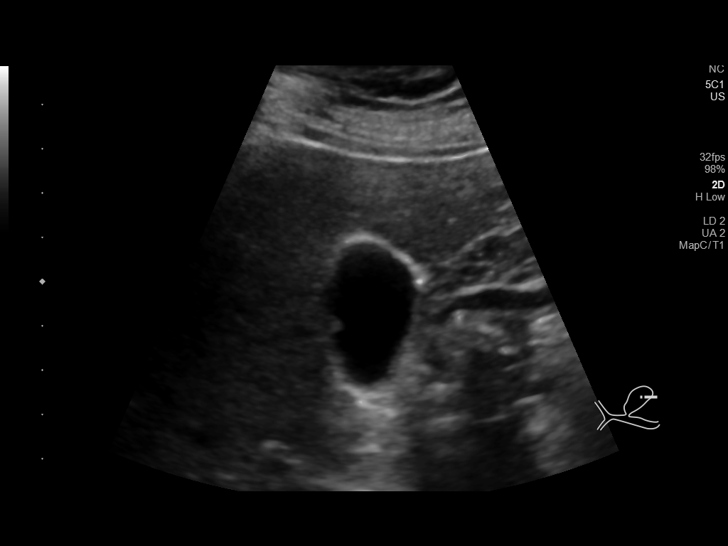
[im 30/55]
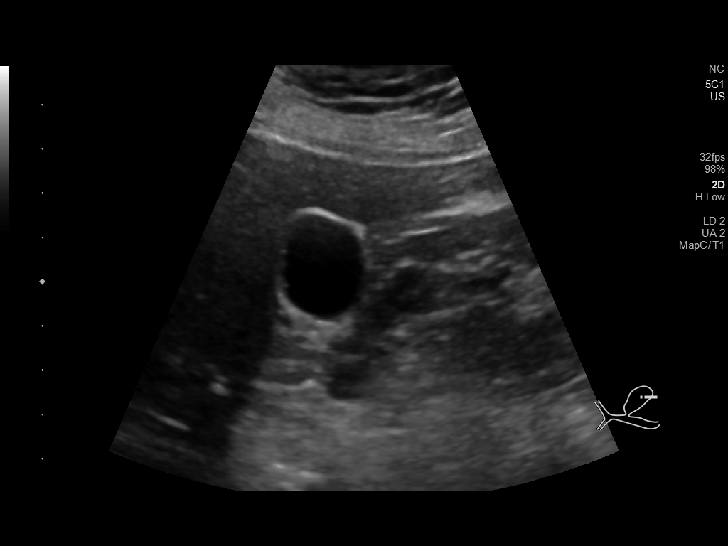
[im 34/55]
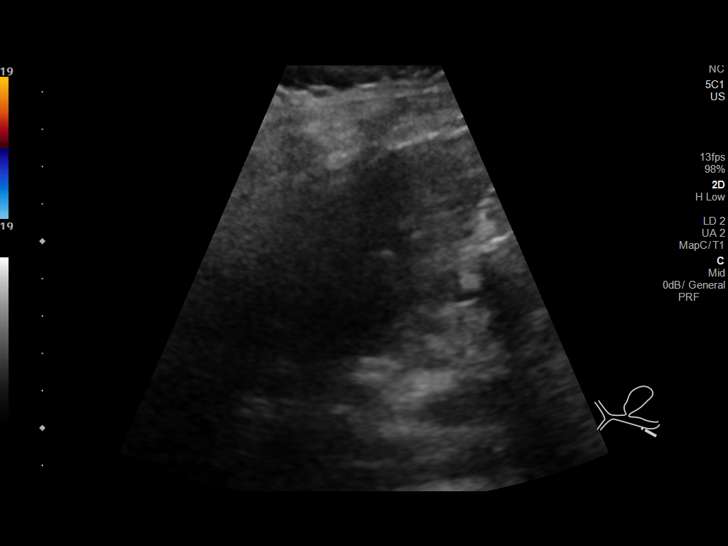
[im 37/55]
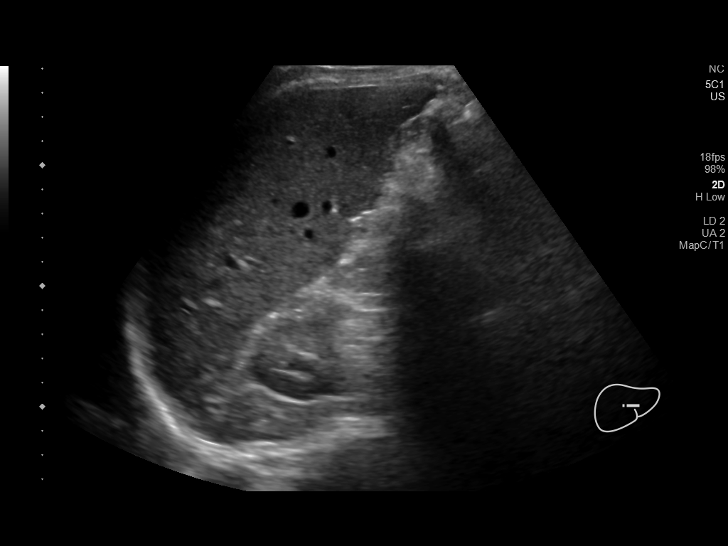
[im 41/55]
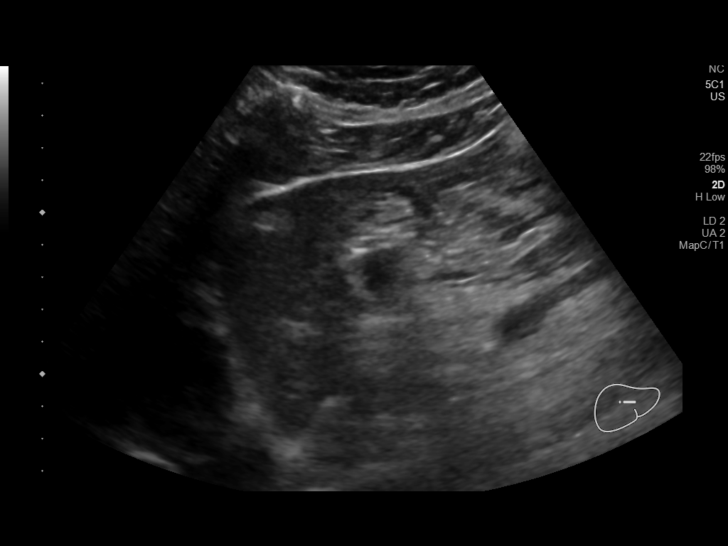
[im 46/55]
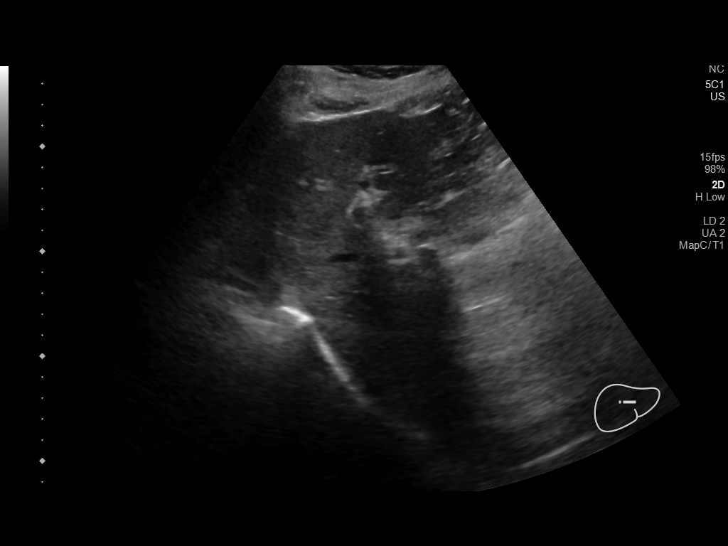
[im 50/55]
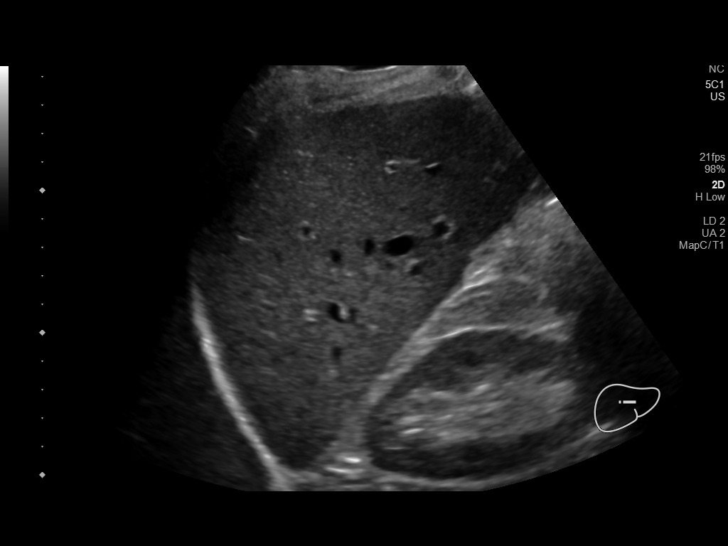
[im 55/55]
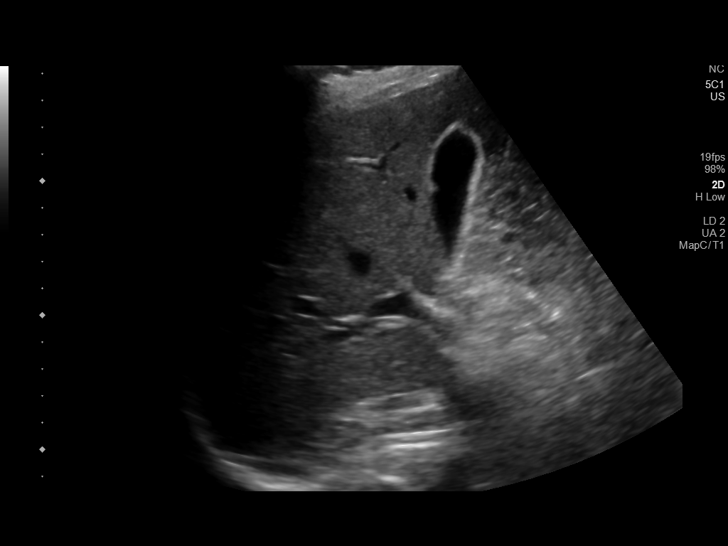

[14 of 25 positions shown; findings below may reference images not displayed]

FINDINGS: Gallbladder:

Within the gallbladder, there is a 4 mm echogenic focus which
neither moves nor shadows, a presumed polyp. There are no echogenic
foci in the gallbladder which move and shadow as is expected with
gallstones. No gallbladder wall thickening or pericholecystic fluid.
No sonographic Murphy sign noted by sonographer.

Common bile duct:

Diameter: 3 mm. No intrahepatic or extrahepatic biliary duct
dilatation.

Liver:

No focal lesion identified. Within normal limits in parenchymal
echogenicity. Portal vein is patent on color Doppler imaging with
normal direction of blood flow towards the liver.

Other: None.
IMPRESSION: 4 mm apparent polyp in the gallbladder. Per consensus guidelines, a
polyp of this small size does not warrant additional imaging
surveillance. Gallbladder otherwise appears unremarkable.

Study otherwise unremarkable.

## 2020-09-17 ENCOUNTER — Other Ambulatory Visit (HOSPITAL_COMMUNITY): Payer: Self-pay

## 2020-09-17 MED FILL — Zolpidem Tartrate Tab 5 MG: ORAL | 90 days supply | Qty: 90 | Fill #1 | Status: CN

## 2020-09-19 ENCOUNTER — Other Ambulatory Visit (HOSPITAL_COMMUNITY): Payer: Self-pay

## 2020-09-19 MED FILL — Telmisartan Tab 20 MG: ORAL | 30 days supply | Qty: 60 | Fill #1 | Status: AC

## 2020-09-19 MED FILL — Zolpidem Tartrate Tab 5 MG: ORAL | 90 days supply | Qty: 90 | Fill #1 | Status: CN

## 2020-09-20 IMAGING — CT CT ABDOMEN AND PELVIS WITH CONTRAST
1 of 3 series · 13 of 32 positions shown, 19 images · IV contrast (APPLIED)
Comparison: Ultrasound 10/12/2018

CLINICAL DATA: Right sided abdominal pain.  Nausea.

EXAM:
CT ABDOMEN AND PELVIS WITH CONTRAST
TECHNIQUE: Multidetector CT imaging of the abdomen and pelvis was performed
using the standard protocol following bolus administration of
intravenous contrast.
CONTRAST:  100mL YEE06Y-CUU IOPAMIDOL (YEE06Y-CUU) INJECTION 61%

[Series 2: abd/pelvis w/cm · axial · 0.71mm/px · z∈[-513,-83]mm · 13 of 100 slices shown, 19 images]
[im 7/100  soft-tissue]
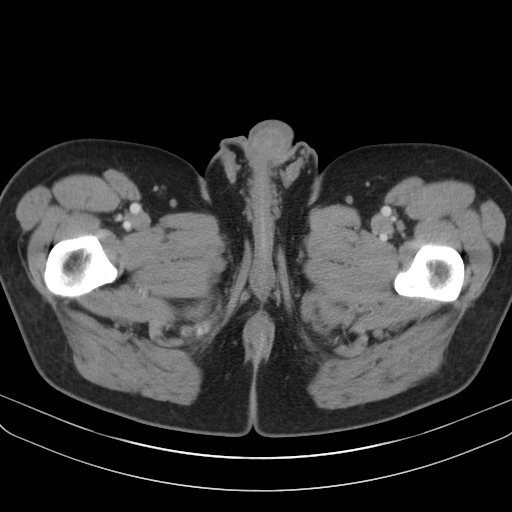
[im 7/100  bone]
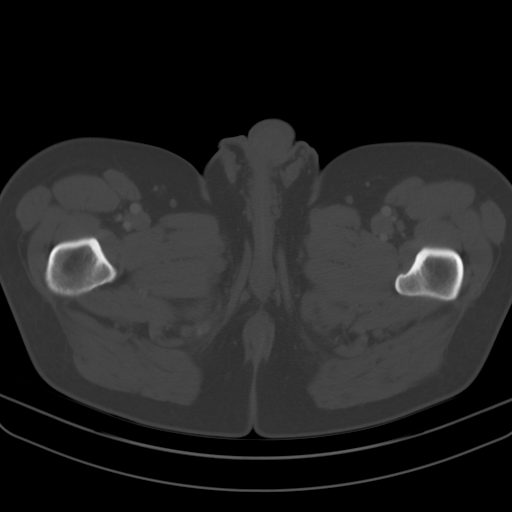
[im 14/100  soft-tissue]
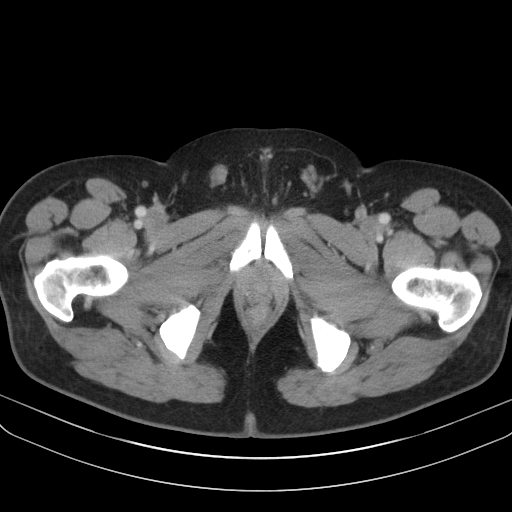
[im 20/100  soft-tissue]
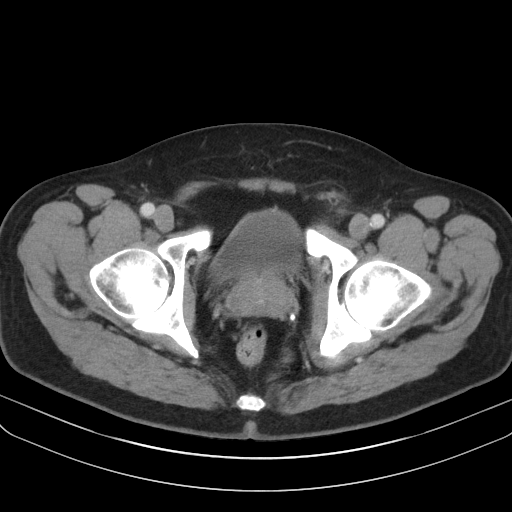
[im 27/100  soft-tissue]
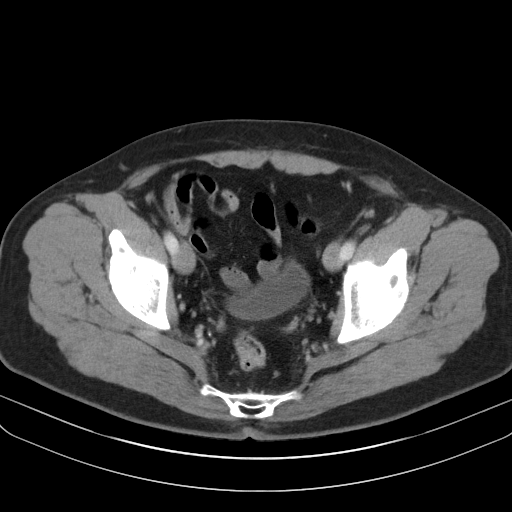
[im 34/100  soft-tissue]
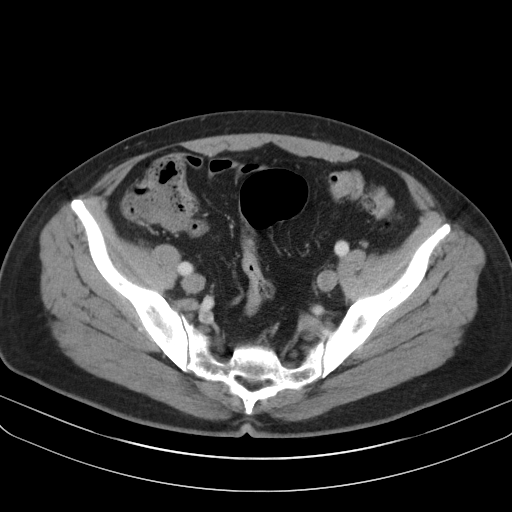
[im 40/100  soft-tissue]
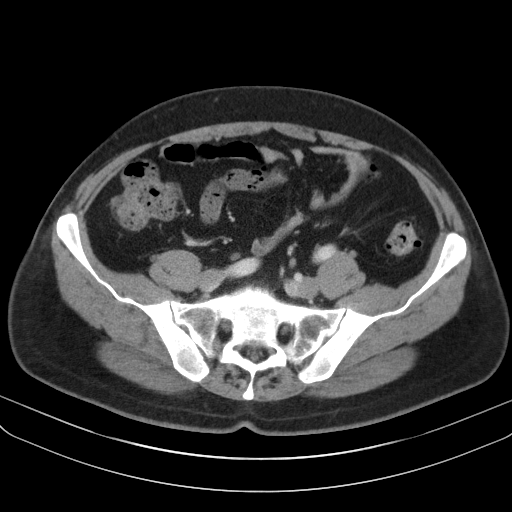
[im 53/100  soft-tissue]
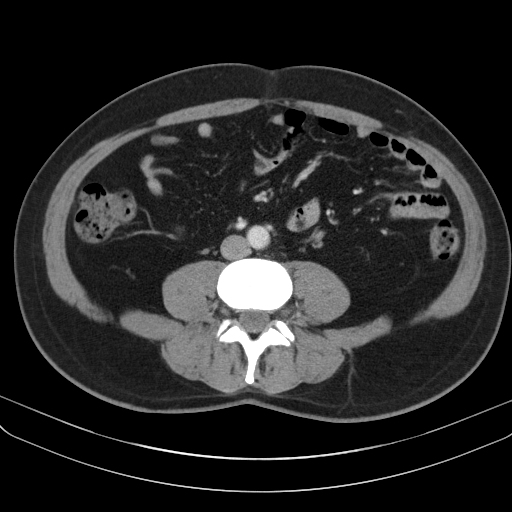
[im 60/100  soft-tissue]
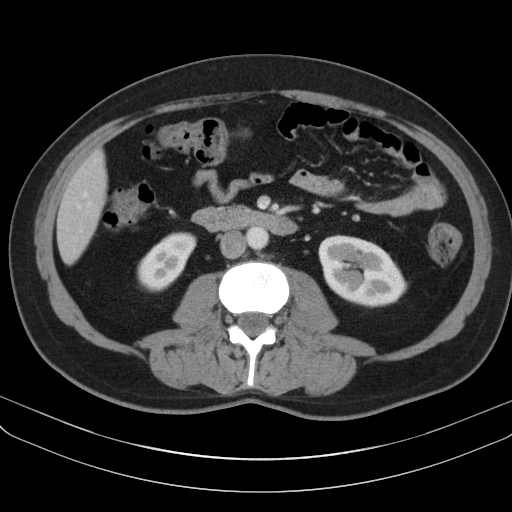
[im 67/100  soft-tissue]
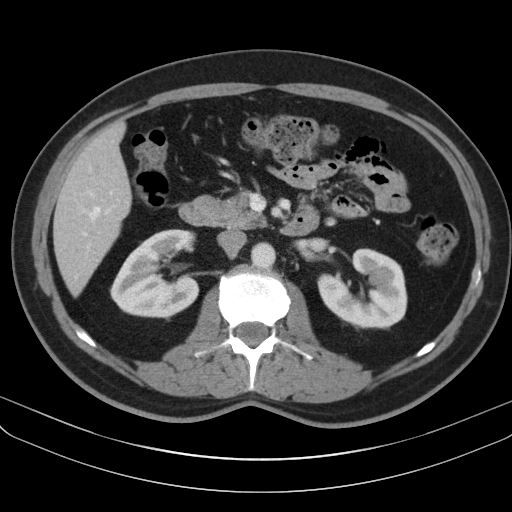
[im 67/100  bone]
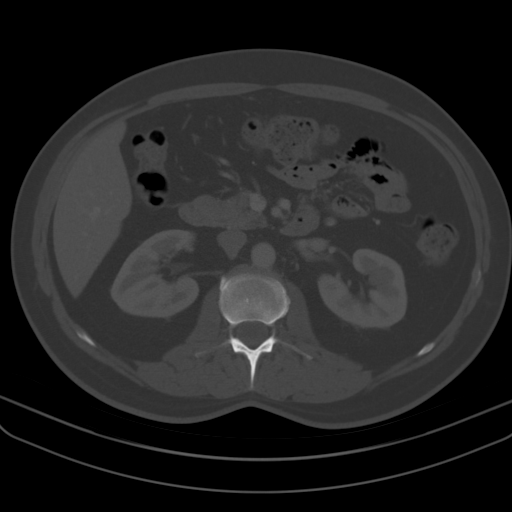
[im 73/100  soft-tissue]
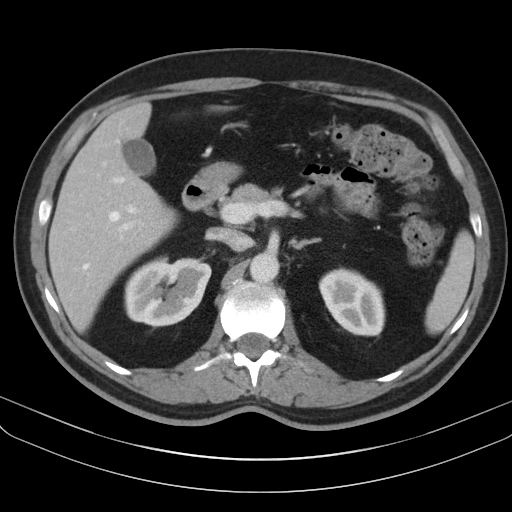
[im 73/100  lung]
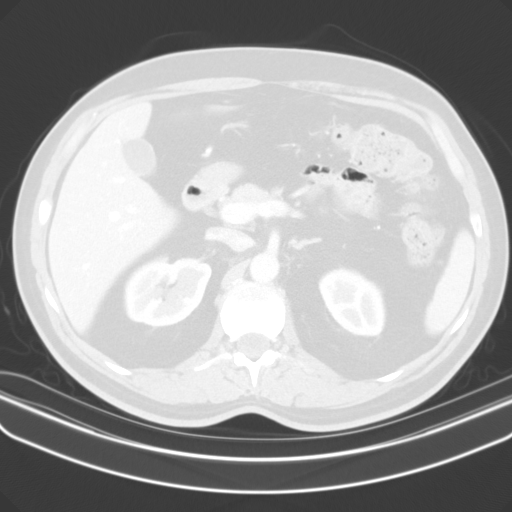
[im 80/100  soft-tissue]
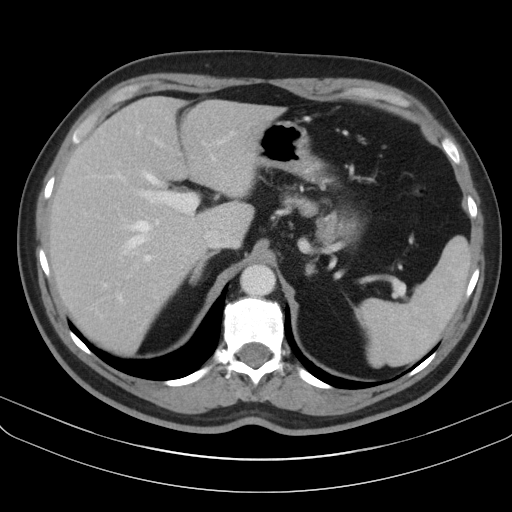
[im 80/100  lung]
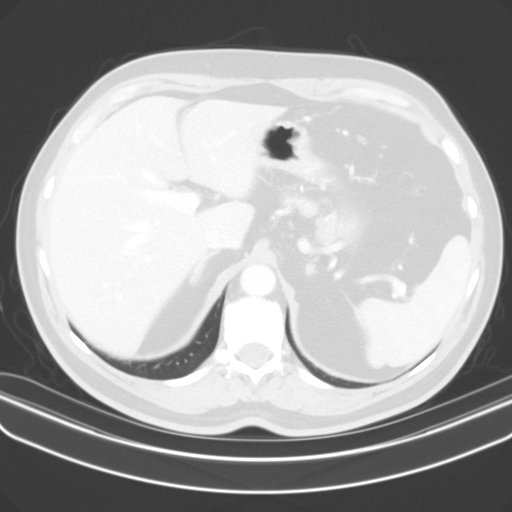
[im 86/100  soft-tissue]
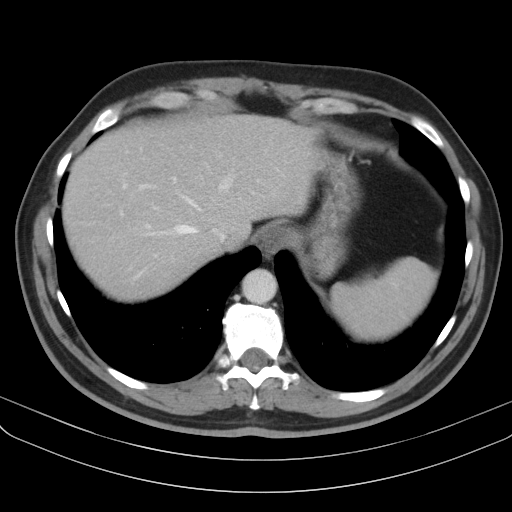
[im 86/100  lung]
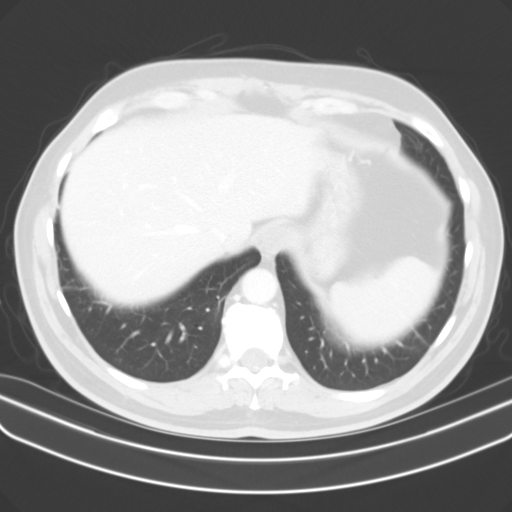
[im 93/100  soft-tissue]
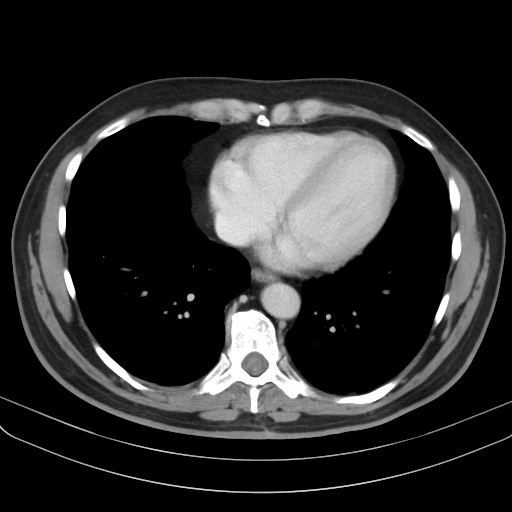
[im 93/100  lung]
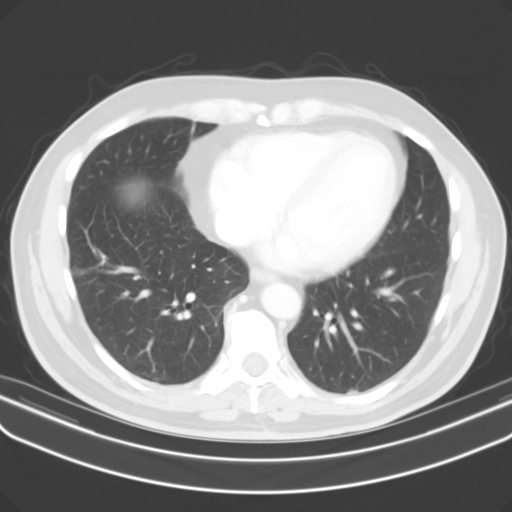

[13 of 32 positions shown; findings below may reference images not displayed]

FINDINGS: Lower chest: Minimal linear scarring in the right lower lobe. No
pleural fluid. Heart size is normal.

Hepatobiliary: Simple cyst in the medial segment of the left lobe
adjacent to the falciform ligament. No significant liver parenchymal
finding. No calcified gallstones.

Pancreas: Normal

Spleen: Normal

Adrenals/Urinary Tract: Adrenal glands are normal. Kidneys are
normal. Bladder is normal.

Stomach/Bowel: No abnormal bowel finding. Fecal volume is normal. No
evidence of ileus or obstruction. No sign of mass or inflammatory
disease. Appendix is normal.

Vascular/Lymphatic: Minimal aortic atherosclerosis. No aneurysm. IVC
is normal. No retroperitoneal adenopathy.

Reproductive: Normal

Other: No free fluid or air.  No sign of hernia.

Musculoskeletal: Normal
IMPRESSION: No cause of the presenting symptoms is identified. No evidence of
bowel pathology. Normal appendix.

Minimal aortic atherosclerosis as seen previously.

## 2020-09-23 ENCOUNTER — Other Ambulatory Visit (HOSPITAL_COMMUNITY): Payer: Self-pay

## 2020-09-24 ENCOUNTER — Other Ambulatory Visit (HOSPITAL_COMMUNITY): Payer: Self-pay

## 2020-09-25 ENCOUNTER — Other Ambulatory Visit (HOSPITAL_COMMUNITY): Payer: Self-pay

## 2020-09-29 ENCOUNTER — Other Ambulatory Visit (HOSPITAL_COMMUNITY): Payer: Self-pay

## 2020-09-29 MED FILL — Zolpidem Tartrate Tab 5 MG: ORAL | 90 days supply | Qty: 90 | Fill #1 | Status: AC

## 2020-09-30 ENCOUNTER — Other Ambulatory Visit (HOSPITAL_COMMUNITY): Payer: Self-pay

## 2020-10-04 ENCOUNTER — Other Ambulatory Visit (HOSPITAL_COMMUNITY): Payer: Self-pay

## 2020-10-04 MED FILL — Amlodipine Besylate Tab 5 MG (Base Equivalent): ORAL | 90 days supply | Qty: 90 | Fill #1 | Status: AC

## 2020-10-04 MED FILL — Bupropion HCl Tab ER 12HR 150 MG: ORAL | 90 days supply | Qty: 90 | Fill #1 | Status: AC

## 2020-10-04 MED FILL — Tizanidine HCl Tab 4 MG (Base Equivalent): ORAL | 90 days supply | Qty: 270 | Fill #1 | Status: AC

## 2020-10-04 MED FILL — Ondansetron Orally Disintegrating Tab 8 MG: ORAL | 7 days supply | Qty: 20 | Fill #0 | Status: AC

## 2020-10-04 MED FILL — Alirocumab Subcutaneous Solution Auto-Injector 150 MG/ML: SUBCUTANEOUS | 84 days supply | Qty: 6 | Fill #1 | Status: AC

## 2020-10-04 MED FILL — Telmisartan Tab 20 MG: ORAL | 30 days supply | Qty: 60 | Fill #2 | Status: CN

## 2020-10-06 ENCOUNTER — Other Ambulatory Visit (HOSPITAL_COMMUNITY): Payer: Self-pay

## 2020-10-07 ENCOUNTER — Other Ambulatory Visit (HOSPITAL_COMMUNITY): Payer: Self-pay

## 2020-10-08 ENCOUNTER — Other Ambulatory Visit (HOSPITAL_COMMUNITY): Payer: Self-pay

## 2020-10-09 ENCOUNTER — Other Ambulatory Visit (HOSPITAL_COMMUNITY): Payer: Self-pay

## 2020-10-09 DIAGNOSIS — M7711 Lateral epicondylitis, right elbow: Secondary | ICD-10-CM | POA: Diagnosis not present

## 2020-10-09 DIAGNOSIS — M25521 Pain in right elbow: Secondary | ICD-10-CM | POA: Diagnosis not present

## 2020-10-10 ENCOUNTER — Other Ambulatory Visit (HOSPITAL_COMMUNITY): Payer: Self-pay

## 2020-10-13 ENCOUNTER — Other Ambulatory Visit (HOSPITAL_COMMUNITY): Payer: Self-pay

## 2020-10-14 ENCOUNTER — Other Ambulatory Visit (HOSPITAL_COMMUNITY): Payer: Self-pay

## 2020-10-14 MED ORDER — EZETIMIBE 10 MG PO TABS
10.0000 mg | ORAL_TABLET | Freq: Every day | ORAL | 3 refills | Status: DC
  Filled 2020-10-14: qty 90, 90d supply, fill #0
  Filled 2021-01-20: qty 90, 90d supply, fill #1
  Filled 2021-04-29: qty 90, 90d supply, fill #2
  Filled 2021-07-26: qty 90, 90d supply, fill #3

## 2020-10-14 MED ORDER — PANTOPRAZOLE SODIUM 40 MG PO TBEC
40.0000 mg | DELAYED_RELEASE_TABLET | Freq: Every day | ORAL | 3 refills | Status: DC
  Filled 2020-10-14: qty 90, 90d supply, fill #0
  Filled 2021-01-20: qty 90, 90d supply, fill #1
  Filled 2021-04-29: qty 90, 90d supply, fill #2
  Filled 2021-07-26: qty 90, 90d supply, fill #3

## 2020-10-14 MED ORDER — LABETALOL HCL 200 MG PO TABS
200.0000 mg | ORAL_TABLET | Freq: Two times a day (BID) | ORAL | 3 refills | Status: DC
  Filled 2020-10-14: qty 180, 90d supply, fill #0
  Filled 2021-01-20: qty 180, 90d supply, fill #1
  Filled 2021-04-29: qty 180, 90d supply, fill #2
  Filled 2021-08-19: qty 180, 90d supply, fill #3

## 2020-10-22 ENCOUNTER — Other Ambulatory Visit (HOSPITAL_COMMUNITY): Payer: Self-pay

## 2020-10-22 MED FILL — Telmisartan Tab 20 MG: ORAL | 30 days supply | Qty: 60 | Fill #2 | Status: AC

## 2020-10-28 ENCOUNTER — Other Ambulatory Visit (HOSPITAL_COMMUNITY): Payer: Self-pay

## 2020-11-18 ENCOUNTER — Other Ambulatory Visit (HOSPITAL_COMMUNITY): Payer: Self-pay

## 2020-11-18 DIAGNOSIS — M5416 Radiculopathy, lumbar region: Secondary | ICD-10-CM | POA: Diagnosis not present

## 2020-11-18 DIAGNOSIS — Z6826 Body mass index (BMI) 26.0-26.9, adult: Secondary | ICD-10-CM | POA: Diagnosis not present

## 2020-11-18 DIAGNOSIS — M47816 Spondylosis without myelopathy or radiculopathy, lumbar region: Secondary | ICD-10-CM | POA: Diagnosis not present

## 2020-11-18 DIAGNOSIS — G5701 Lesion of sciatic nerve, right lower limb: Secondary | ICD-10-CM | POA: Diagnosis not present

## 2020-11-18 DIAGNOSIS — I10 Essential (primary) hypertension: Secondary | ICD-10-CM | POA: Diagnosis not present

## 2020-11-18 MED ORDER — OXYCODONE-ACETAMINOPHEN 5-325 MG PO TABS
ORAL_TABLET | ORAL | 0 refills | Status: DC
  Filled 2020-11-26: qty 90, fill #0
  Filled 2020-11-27: qty 90, 22d supply, fill #0

## 2020-11-18 MED ORDER — OXYCODONE-ACETAMINOPHEN 5-325 MG PO TABS
ORAL_TABLET | ORAL | 0 refills | Status: DC
  Filled 2020-12-31: qty 90, 23d supply, fill #0

## 2020-11-18 MED ORDER — BACLOFEN 10 MG PO TABS
ORAL_TABLET | ORAL | 2 refills | Status: DC
  Filled 2020-11-18: qty 90, 30d supply, fill #0
  Filled 2021-08-19: qty 90, 30d supply, fill #1
  Filled 2021-09-20: qty 90, 30d supply, fill #2

## 2020-11-18 MED ORDER — OXYCODONE-ACETAMINOPHEN 5-325 MG PO TABS
ORAL_TABLET | ORAL | 0 refills | Status: DC
  Filled 2021-02-02: qty 90, 23d supply, fill #0

## 2020-11-19 ENCOUNTER — Other Ambulatory Visit (HOSPITAL_COMMUNITY): Payer: Self-pay

## 2020-11-20 ENCOUNTER — Other Ambulatory Visit (HOSPITAL_COMMUNITY): Payer: Self-pay

## 2020-11-21 ENCOUNTER — Other Ambulatory Visit (HOSPITAL_COMMUNITY): Payer: Self-pay

## 2020-11-21 MED ORDER — TELMISARTAN 20 MG PO TABS
ORAL_TABLET | ORAL | 3 refills | Status: DC
  Filled 2020-11-21: qty 180, 90d supply, fill #0
  Filled 2021-02-22: qty 10, 5d supply, fill #1
  Filled 2021-02-23: qty 170, 897d supply, fill #1
  Filled 2021-02-24: qty 170, 85d supply, fill #2
  Filled 2021-05-24: qty 170, 85d supply, fill #3
  Filled 2021-08-19: qty 170, 85d supply, fill #4
  Filled 2021-09-20: qty 170, 85d supply, fill #5

## 2020-11-24 ENCOUNTER — Other Ambulatory Visit (HOSPITAL_COMMUNITY): Payer: Self-pay

## 2020-11-26 ENCOUNTER — Other Ambulatory Visit (HOSPITAL_COMMUNITY): Payer: Self-pay

## 2020-11-27 ENCOUNTER — Other Ambulatory Visit (HOSPITAL_COMMUNITY): Payer: Self-pay

## 2020-12-03 ENCOUNTER — Other Ambulatory Visit (HOSPITAL_COMMUNITY): Payer: Self-pay

## 2020-12-03 MED ORDER — CARESTART COVID-19 HOME TEST VI KIT
PACK | 0 refills | Status: DC
  Filled 2020-12-03: qty 4, 4d supply, fill #0

## 2020-12-30 DIAGNOSIS — H6691 Otitis media, unspecified, right ear: Secondary | ICD-10-CM | POA: Diagnosis not present

## 2020-12-31 ENCOUNTER — Other Ambulatory Visit (HOSPITAL_COMMUNITY): Payer: Self-pay | Admitting: Pharmacist

## 2020-12-31 ENCOUNTER — Other Ambulatory Visit (HOSPITAL_COMMUNITY): Payer: Self-pay

## 2021-01-01 ENCOUNTER — Other Ambulatory Visit (HOSPITAL_COMMUNITY): Payer: Self-pay

## 2021-01-01 MED ORDER — ZOLPIDEM TARTRATE 5 MG PO TABS
5.0000 mg | ORAL_TABLET | Freq: Every evening | ORAL | 0 refills | Status: DC
  Filled 2021-01-01: qty 90, 90d supply, fill #0

## 2021-01-01 MED ORDER — AMLODIPINE BESYLATE 5 MG PO TABS
5.0000 mg | ORAL_TABLET | Freq: Every evening | ORAL | 3 refills | Status: DC
  Filled 2021-01-01: qty 90, 90d supply, fill #0
  Filled 2021-04-01: qty 90, 90d supply, fill #1
  Filled 2021-06-29: qty 90, 90d supply, fill #2
  Filled 2021-09-20: qty 90, 90d supply, fill #3

## 2021-01-06 ENCOUNTER — Other Ambulatory Visit (HOSPITAL_COMMUNITY): Payer: Self-pay | Admitting: Pharmacist

## 2021-01-06 ENCOUNTER — Other Ambulatory Visit (HOSPITAL_COMMUNITY): Payer: Self-pay

## 2021-01-07 ENCOUNTER — Other Ambulatory Visit (HOSPITAL_COMMUNITY): Payer: Self-pay

## 2021-01-07 MED ORDER — CARESTART COVID-19 HOME TEST VI KIT
PACK | 0 refills | Status: DC
  Filled 2021-01-07: qty 4, 4d supply, fill #0

## 2021-01-08 ENCOUNTER — Other Ambulatory Visit (HOSPITAL_COMMUNITY): Payer: Self-pay

## 2021-01-20 ENCOUNTER — Other Ambulatory Visit (HOSPITAL_COMMUNITY): Payer: Self-pay

## 2021-01-20 MED FILL — Bupropion HCl Tab ER 12HR 150 MG: ORAL | 90 days supply | Qty: 90 | Fill #2 | Status: AC

## 2021-01-27 ENCOUNTER — Other Ambulatory Visit (HOSPITAL_COMMUNITY): Payer: Self-pay

## 2021-01-27 MED ORDER — CEFDINIR 300 MG PO CAPS
ORAL_CAPSULE | ORAL | 0 refills | Status: DC
  Filled 2021-01-27: qty 14, 7d supply, fill #0

## 2021-01-29 IMAGING — DX DG CHEST 1V PORT
1 series · 1 of 1 positions shown · non-contrast
Comparison: February 03, 2008

CLINICAL DATA: Weakness

EXAM:
PORTABLE CHEST 1 VIEW

[chest ap]
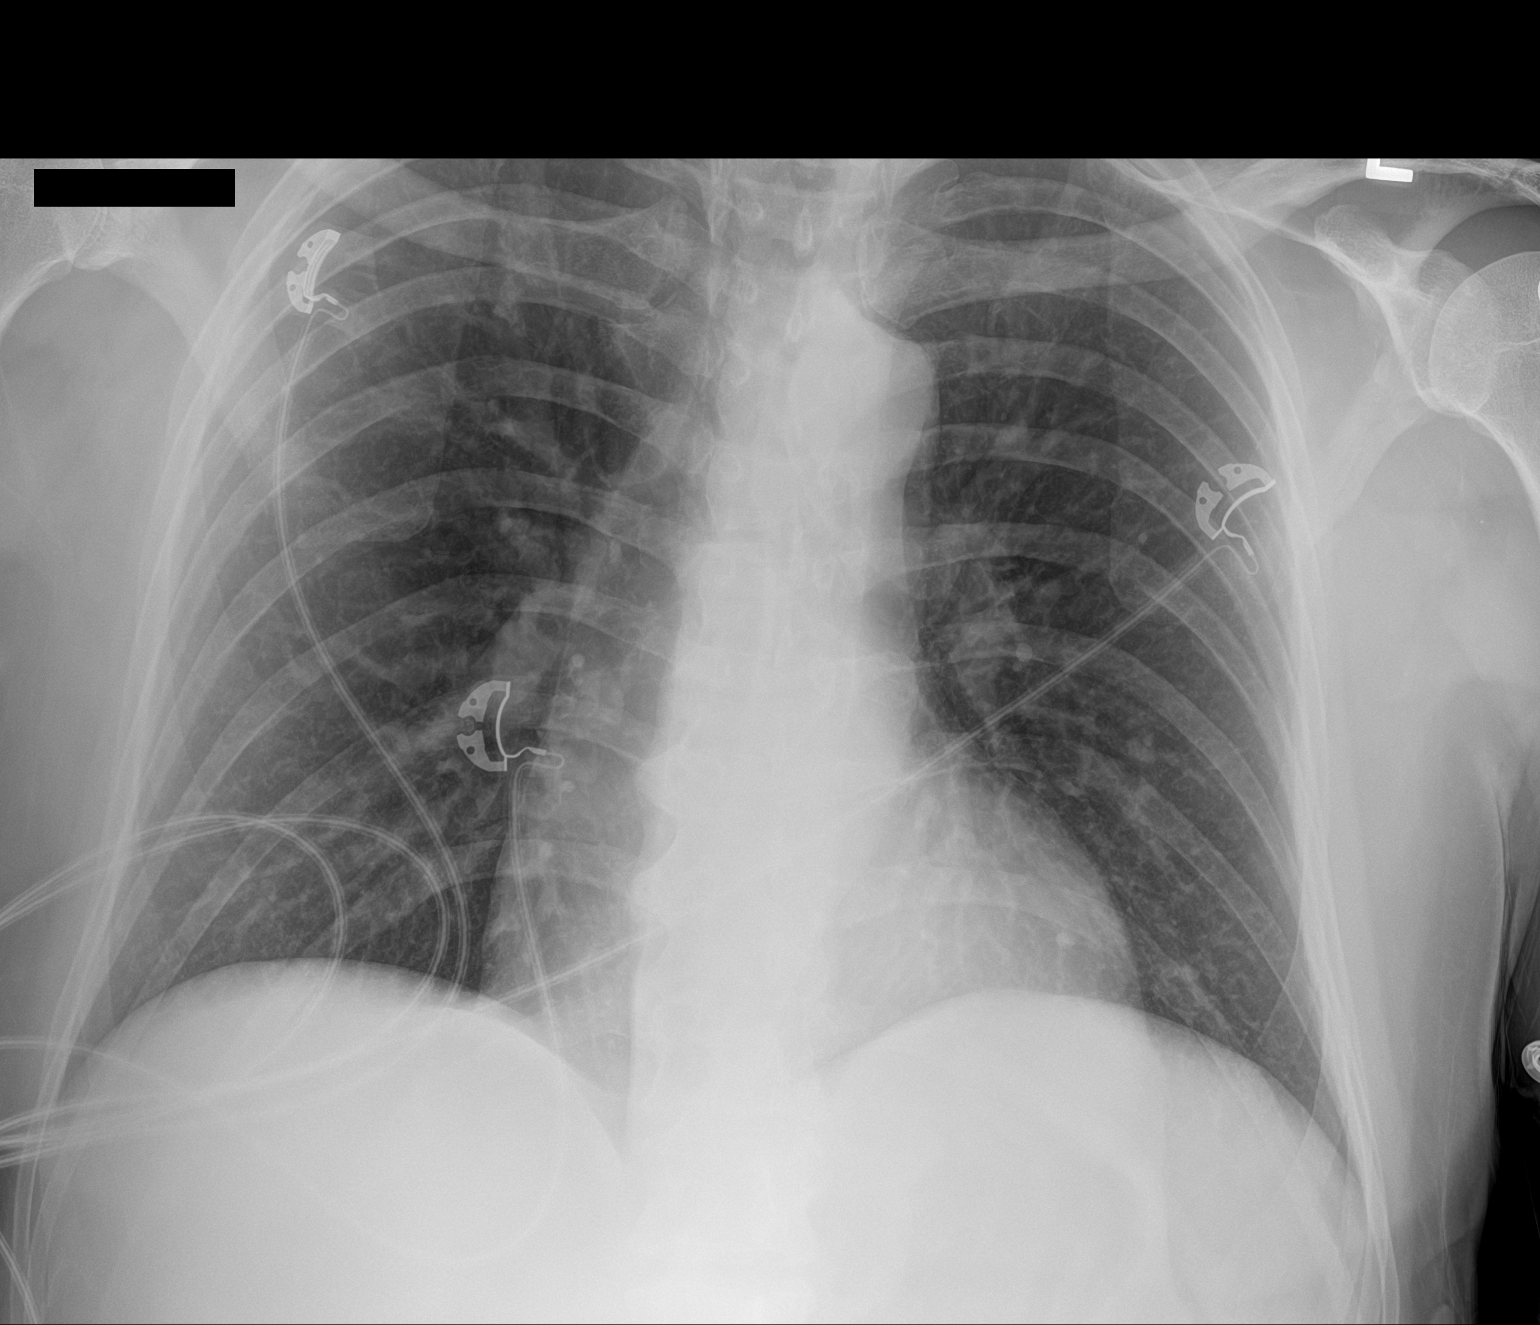

[1 of 1 positions shown; findings below may reference images not displayed]

FINDINGS: The heart size and mediastinal contours are within normal limits.
Both lungs are clear. The visualized skeletal structures are
unremarkable.
IMPRESSION: No active disease.

## 2021-01-31 ENCOUNTER — Other Ambulatory Visit (HOSPITAL_COMMUNITY): Payer: Self-pay

## 2021-02-02 ENCOUNTER — Other Ambulatory Visit (HOSPITAL_COMMUNITY): Payer: Self-pay

## 2021-02-03 ENCOUNTER — Other Ambulatory Visit (HOSPITAL_COMMUNITY): Payer: Self-pay

## 2021-02-16 ENCOUNTER — Other Ambulatory Visit (HOSPITAL_COMMUNITY): Payer: Self-pay

## 2021-02-16 DIAGNOSIS — M5416 Radiculopathy, lumbar region: Secondary | ICD-10-CM | POA: Diagnosis not present

## 2021-02-16 DIAGNOSIS — M47816 Spondylosis without myelopathy or radiculopathy, lumbar region: Secondary | ICD-10-CM | POA: Diagnosis not present

## 2021-02-16 DIAGNOSIS — G5701 Lesion of sciatic nerve, right lower limb: Secondary | ICD-10-CM | POA: Diagnosis not present

## 2021-02-16 MED ORDER — OXYCODONE-ACETAMINOPHEN 5-325 MG PO TABS
ORAL_TABLET | ORAL | 0 refills | Status: DC
  Filled 2021-04-01: qty 90, 23d supply, fill #0

## 2021-02-16 MED ORDER — OXYCODONE-ACETAMINOPHEN 5-325 MG PO TABS
ORAL_TABLET | ORAL | 0 refills | Status: DC
  Filled 2021-05-03: qty 90, 23d supply, fill #0

## 2021-02-16 MED ORDER — BACLOFEN 10 MG PO TABS
10.0000 mg | ORAL_TABLET | Freq: Three times a day (TID) | ORAL | 2 refills | Status: DC
  Filled 2021-02-16: qty 90, 30d supply, fill #0
  Filled 2021-04-01: qty 90, 30d supply, fill #1
  Filled 2021-04-29: qty 90, 30d supply, fill #2

## 2021-02-16 MED ORDER — OXYCODONE-ACETAMINOPHEN 5-325 MG PO TABS
ORAL_TABLET | ORAL | 0 refills | Status: DC
  Filled 2021-03-04: qty 90, 22d supply, fill #0

## 2021-02-23 ENCOUNTER — Other Ambulatory Visit (HOSPITAL_COMMUNITY): Payer: Self-pay

## 2021-02-24 ENCOUNTER — Other Ambulatory Visit (HOSPITAL_COMMUNITY): Payer: Self-pay

## 2021-03-05 ENCOUNTER — Other Ambulatory Visit (HOSPITAL_COMMUNITY): Payer: Self-pay

## 2021-03-23 DIAGNOSIS — M7711 Lateral epicondylitis, right elbow: Secondary | ICD-10-CM | POA: Diagnosis not present

## 2021-03-26 ENCOUNTER — Other Ambulatory Visit (HOSPITAL_COMMUNITY): Payer: Self-pay

## 2021-03-26 MED FILL — Alirocumab Subcutaneous Solution Auto-Injector 150 MG/ML: SUBCUTANEOUS | 84 days supply | Qty: 6 | Fill #2 | Status: AC

## 2021-04-01 ENCOUNTER — Other Ambulatory Visit (HOSPITAL_COMMUNITY): Payer: Self-pay

## 2021-04-02 ENCOUNTER — Other Ambulatory Visit (HOSPITAL_COMMUNITY): Payer: Self-pay

## 2021-04-02 MED ORDER — ZOLPIDEM TARTRATE 5 MG PO TABS
ORAL_TABLET | ORAL | 1 refills | Status: DC
  Filled 2021-04-02: qty 90, 90d supply, fill #0
  Filled 2021-06-28: qty 90, 90d supply, fill #1

## 2021-04-24 ENCOUNTER — Other Ambulatory Visit (HOSPITAL_COMMUNITY): Payer: Self-pay | Admitting: Internal Medicine

## 2021-04-24 DIAGNOSIS — M79604 Pain in right leg: Secondary | ICD-10-CM

## 2021-04-27 ENCOUNTER — Encounter (HOSPITAL_COMMUNITY): Payer: Self-pay

## 2021-04-27 ENCOUNTER — Ambulatory Visit (HOSPITAL_COMMUNITY)
Admission: RE | Admit: 2021-04-27 | Discharge: 2021-04-27 | Disposition: A | Discharge status: Home / Self Care | Payer: 59 | Source: Ambulatory Visit | Attending: Internal Medicine | Admitting: Internal Medicine

## 2021-04-27 ENCOUNTER — Other Ambulatory Visit: Payer: Self-pay

## 2021-04-27 DIAGNOSIS — M79605 Pain in left leg: Secondary | ICD-10-CM | POA: Diagnosis not present

## 2021-04-27 DIAGNOSIS — M79604 Pain in right leg: Secondary | ICD-10-CM | POA: Diagnosis not present

## 2021-04-30 ENCOUNTER — Other Ambulatory Visit (HOSPITAL_COMMUNITY): Payer: Self-pay

## 2021-05-04 ENCOUNTER — Other Ambulatory Visit (HOSPITAL_COMMUNITY): Payer: Self-pay

## 2021-05-04 DIAGNOSIS — F1120 Opioid dependence, uncomplicated: Secondary | ICD-10-CM | POA: Diagnosis not present

## 2021-05-04 DIAGNOSIS — M47816 Spondylosis without myelopathy or radiculopathy, lumbar region: Secondary | ICD-10-CM | POA: Diagnosis not present

## 2021-05-04 DIAGNOSIS — M5416 Radiculopathy, lumbar region: Secondary | ICD-10-CM | POA: Diagnosis not present

## 2021-05-04 DIAGNOSIS — G5701 Lesion of sciatic nerve, right lower limb: Secondary | ICD-10-CM | POA: Diagnosis not present

## 2021-05-04 MED ORDER — OXYCODONE-ACETAMINOPHEN 5-325 MG PO TABS
ORAL_TABLET | ORAL | 0 refills | Status: DC
  Filled 2021-06-29: qty 90, 22d supply, fill #0

## 2021-05-04 MED ORDER — METHOCARBAMOL 500 MG PO TABS
ORAL_TABLET | ORAL | 2 refills | Status: DC
  Filled 2021-05-04: qty 90, 30d supply, fill #0
  Filled 2021-06-28: qty 90, 30d supply, fill #1

## 2021-05-04 MED ORDER — OXYCODONE-ACETAMINOPHEN 5-325 MG PO TABS
ORAL_TABLET | ORAL | 0 refills | Status: DC
  Filled 2021-05-31: qty 90, 23d supply, fill #0

## 2021-05-04 MED ORDER — BUPROPION HCL ER (SR) 150 MG PO TB12
ORAL_TABLET | ORAL | 3 refills | Status: DC
  Filled 2021-05-04: qty 90, 90d supply, fill #0
  Filled 2021-07-26: qty 90, 90d supply, fill #1
  Filled 2021-09-20 – 2021-10-25 (×2): qty 90, 90d supply, fill #2
  Filled 2022-01-24: qty 90, 90d supply, fill #3

## 2021-05-19 DIAGNOSIS — Z125 Encounter for screening for malignant neoplasm of prostate: Secondary | ICD-10-CM | POA: Diagnosis not present

## 2021-05-19 DIAGNOSIS — I10 Essential (primary) hypertension: Secondary | ICD-10-CM | POA: Diagnosis not present

## 2021-05-19 DIAGNOSIS — R7301 Impaired fasting glucose: Secondary | ICD-10-CM | POA: Diagnosis not present

## 2021-05-19 DIAGNOSIS — E785 Hyperlipidemia, unspecified: Secondary | ICD-10-CM | POA: Diagnosis not present

## 2021-05-19 DIAGNOSIS — E538 Deficiency of other specified B group vitamins: Secondary | ICD-10-CM | POA: Diagnosis not present

## 2021-05-25 ENCOUNTER — Other Ambulatory Visit (HOSPITAL_COMMUNITY): Payer: Self-pay

## 2021-05-26 ENCOUNTER — Other Ambulatory Visit (HOSPITAL_COMMUNITY): Payer: Self-pay

## 2021-05-26 DIAGNOSIS — Z23 Encounter for immunization: Secondary | ICD-10-CM | POA: Diagnosis not present

## 2021-05-26 DIAGNOSIS — E785 Hyperlipidemia, unspecified: Secondary | ICD-10-CM | POA: Diagnosis not present

## 2021-05-26 DIAGNOSIS — G6289 Other specified polyneuropathies: Secondary | ICD-10-CM | POA: Diagnosis not present

## 2021-05-26 DIAGNOSIS — I700 Atherosclerosis of aorta: Secondary | ICD-10-CM | POA: Diagnosis not present

## 2021-05-26 DIAGNOSIS — F329 Major depressive disorder, single episode, unspecified: Secondary | ICD-10-CM | POA: Diagnosis not present

## 2021-05-26 DIAGNOSIS — R7301 Impaired fasting glucose: Secondary | ICD-10-CM | POA: Diagnosis not present

## 2021-05-26 DIAGNOSIS — Z0000 Encounter for general adult medical examination without abnormal findings: Secondary | ICD-10-CM | POA: Diagnosis not present

## 2021-05-26 DIAGNOSIS — R809 Proteinuria, unspecified: Secondary | ICD-10-CM | POA: Diagnosis not present

## 2021-05-26 DIAGNOSIS — M1990 Unspecified osteoarthritis, unspecified site: Secondary | ICD-10-CM | POA: Diagnosis not present

## 2021-05-26 DIAGNOSIS — I10 Essential (primary) hypertension: Secondary | ICD-10-CM | POA: Diagnosis not present

## 2021-05-26 DIAGNOSIS — R82998 Other abnormal findings in urine: Secondary | ICD-10-CM | POA: Diagnosis not present

## 2021-05-26 DIAGNOSIS — Z1331 Encounter for screening for depression: Secondary | ICD-10-CM | POA: Diagnosis not present

## 2021-05-26 DIAGNOSIS — Z1212 Encounter for screening for malignant neoplasm of rectum: Secondary | ICD-10-CM | POA: Diagnosis not present

## 2021-05-26 MED ORDER — ZOSTER VAC RECOMB ADJUVANTED 50 MCG/0.5ML IM SUSR
INTRAMUSCULAR | 1 refills | Status: DC
  Filled 2021-05-26: qty 0.5, 1d supply, fill #0
  Filled 2021-10-09: qty 0.5, 1d supply, fill #1

## 2021-05-26 MED ORDER — DESVENLAFAXINE SUCCINATE ER 50 MG PO TB24
ORAL_TABLET | ORAL | 11 refills | Status: DC
  Filled 2021-05-26: qty 30, 30d supply, fill #0
  Filled 2021-06-24: qty 30, 30d supply, fill #1
  Filled 2021-07-19: qty 30, 30d supply, fill #2
  Filled 2021-08-19: qty 30, 30d supply, fill #3
  Filled 2021-09-17: qty 30, 30d supply, fill #4
  Filled 2021-10-25: qty 30, 30d supply, fill #5
  Filled 2021-11-19: qty 30, 30d supply, fill #6
  Filled 2021-12-20: qty 30, 30d supply, fill #7
  Filled 2021-12-24 – 2022-01-19 (×2): qty 30, 30d supply, fill #8
  Filled 2022-02-21: qty 30, 30d supply, fill #9
  Filled 2022-03-21: qty 30, 30d supply, fill #10
  Filled 2022-04-18: qty 30, 30d supply, fill #11

## 2021-06-01 ENCOUNTER — Other Ambulatory Visit (HOSPITAL_COMMUNITY): Payer: Self-pay

## 2021-06-24 ENCOUNTER — Other Ambulatory Visit (HOSPITAL_COMMUNITY): Payer: Self-pay

## 2021-06-26 ENCOUNTER — Other Ambulatory Visit (HOSPITAL_COMMUNITY): Payer: Self-pay

## 2021-06-26 MED ORDER — PRALUENT 150 MG/ML ~~LOC~~ SOAJ
SUBCUTANEOUS | 3 refills | Status: DC
  Filled 2021-06-26: qty 6, 90d supply, fill #0
  Filled 2021-09-20: qty 6, 90d supply, fill #1
  Filled 2022-03-04: qty 6, 90d supply, fill #2
  Filled 2022-06-17: qty 6, 90d supply, fill #3

## 2021-06-28 ENCOUNTER — Other Ambulatory Visit (HOSPITAL_COMMUNITY): Payer: Self-pay

## 2021-06-29 ENCOUNTER — Other Ambulatory Visit (HOSPITAL_COMMUNITY): Payer: Self-pay

## 2021-06-30 ENCOUNTER — Other Ambulatory Visit (HOSPITAL_COMMUNITY): Payer: Self-pay

## 2021-06-30 MED ORDER — ERYTHROMYCIN 2 % EX GEL
CUTANEOUS | 4 refills | Status: DC
  Filled 2021-06-30: qty 30, 30d supply, fill #0

## 2021-07-01 ENCOUNTER — Other Ambulatory Visit (HOSPITAL_COMMUNITY): Payer: Self-pay

## 2021-07-13 ENCOUNTER — Other Ambulatory Visit (HOSPITAL_COMMUNITY): Payer: Self-pay

## 2021-07-13 DIAGNOSIS — G5701 Lesion of sciatic nerve, right lower limb: Secondary | ICD-10-CM | POA: Diagnosis not present

## 2021-07-13 DIAGNOSIS — F1120 Opioid dependence, uncomplicated: Secondary | ICD-10-CM | POA: Diagnosis not present

## 2021-07-13 DIAGNOSIS — M47816 Spondylosis without myelopathy or radiculopathy, lumbar region: Secondary | ICD-10-CM | POA: Diagnosis not present

## 2021-07-13 DIAGNOSIS — Z6825 Body mass index (BMI) 25.0-25.9, adult: Secondary | ICD-10-CM | POA: Diagnosis not present

## 2021-07-13 MED ORDER — OXYCODONE-ACETAMINOPHEN 5-325 MG PO TABS
ORAL_TABLET | ORAL | 0 refills | Status: DC
  Filled 2021-09-26: qty 90, 23d supply, fill #0

## 2021-07-13 MED ORDER — TIZANIDINE HCL 4 MG PO TABS
ORAL_TABLET | ORAL | 2 refills | Status: DC
  Filled 2021-07-13: qty 90, 30d supply, fill #0
  Filled 2021-08-10: qty 90, 30d supply, fill #1
  Filled 2021-09-10: qty 90, 30d supply, fill #2

## 2021-07-13 MED ORDER — OXYCODONE-ACETAMINOPHEN 5-325 MG PO TABS
ORAL_TABLET | ORAL | 0 refills | Status: DC
  Filled 2021-07-26: qty 90, fill #0
  Filled 2021-07-27: qty 90, 30d supply, fill #0
  Filled 2021-07-27: qty 90, 22d supply, fill #0
  Filled 2021-07-29: qty 90, 23d supply, fill #0

## 2021-07-13 MED ORDER — OXYCODONE-ACETAMINOPHEN 5-325 MG PO TABS
ORAL_TABLET | ORAL | 0 refills | Status: DC
  Filled 2021-08-27: qty 90, 30d supply, fill #0

## 2021-07-20 ENCOUNTER — Other Ambulatory Visit (HOSPITAL_COMMUNITY): Payer: Self-pay

## 2021-07-27 ENCOUNTER — Other Ambulatory Visit (HOSPITAL_COMMUNITY): Payer: Self-pay

## 2021-07-29 ENCOUNTER — Other Ambulatory Visit (HOSPITAL_COMMUNITY): Payer: Self-pay

## 2021-08-11 ENCOUNTER — Other Ambulatory Visit (HOSPITAL_COMMUNITY): Payer: Self-pay

## 2021-08-19 ENCOUNTER — Other Ambulatory Visit (HOSPITAL_COMMUNITY): Payer: Self-pay

## 2021-08-20 ENCOUNTER — Other Ambulatory Visit (HOSPITAL_COMMUNITY): Payer: Self-pay

## 2021-08-21 ENCOUNTER — Other Ambulatory Visit (HOSPITAL_COMMUNITY): Payer: Self-pay

## 2021-08-28 ENCOUNTER — Other Ambulatory Visit (HOSPITAL_COMMUNITY): Payer: Self-pay

## 2021-09-11 ENCOUNTER — Other Ambulatory Visit (HOSPITAL_COMMUNITY): Payer: Self-pay

## 2021-09-15 ENCOUNTER — Other Ambulatory Visit (HOSPITAL_COMMUNITY): Payer: Self-pay

## 2021-09-17 ENCOUNTER — Other Ambulatory Visit (HOSPITAL_COMMUNITY): Payer: Self-pay

## 2021-09-18 ENCOUNTER — Other Ambulatory Visit (HOSPITAL_COMMUNITY): Payer: Self-pay

## 2021-09-20 ENCOUNTER — Other Ambulatory Visit (HOSPITAL_COMMUNITY): Payer: Self-pay

## 2021-09-21 ENCOUNTER — Other Ambulatory Visit (HOSPITAL_COMMUNITY): Payer: Self-pay

## 2021-09-21 MED ORDER — TELMISARTAN 20 MG PO TABS
20.0000 mg | ORAL_TABLET | Freq: Two times a day (BID) | ORAL | 2 refills | Status: DC
  Filled 2021-09-21 – 2021-10-25 (×2): qty 180, 90d supply, fill #0
  Filled 2022-01-19: qty 180, 90d supply, fill #1
  Filled 2022-04-18: qty 180, 90d supply, fill #2

## 2021-09-21 MED ORDER — LABETALOL HCL 200 MG PO TABS
200.0000 mg | ORAL_TABLET | Freq: Two times a day (BID) | ORAL | 2 refills | Status: DC
  Filled 2021-09-21 – 2021-11-19 (×2): qty 180, 90d supply, fill #0
  Filled 2022-02-21: qty 180, 90d supply, fill #1
  Filled 2022-05-20: qty 180, 90d supply, fill #2

## 2021-09-21 MED ORDER — PANTOPRAZOLE SODIUM 40 MG PO TBEC
40.0000 mg | DELAYED_RELEASE_TABLET | Freq: Every day | ORAL | 2 refills | Status: DC
  Filled 2021-09-21 – 2021-10-25 (×2): qty 90, 90d supply, fill #0
  Filled 2022-01-19: qty 90, 90d supply, fill #1
  Filled 2022-04-18: qty 90, 90d supply, fill #2

## 2021-09-21 MED ORDER — EZETIMIBE 10 MG PO TABS
10.0000 mg | ORAL_TABLET | Freq: Every day | ORAL | 2 refills | Status: DC
  Filled 2021-09-21 – 2021-10-25 (×2): qty 90, 90d supply, fill #0
  Filled 2022-01-19: qty 90, 90d supply, fill #1
  Filled 2022-04-18: qty 90, 90d supply, fill #2

## 2021-09-22 ENCOUNTER — Other Ambulatory Visit (HOSPITAL_COMMUNITY): Payer: Self-pay

## 2021-09-23 ENCOUNTER — Other Ambulatory Visit (HOSPITAL_COMMUNITY): Payer: Self-pay

## 2021-09-23 MED ORDER — ZOLPIDEM TARTRATE 5 MG PO TABS
5.0000 mg | ORAL_TABLET | Freq: Every evening | ORAL | 1 refills | Status: DC | PRN
  Filled 2021-09-23 – 2021-09-25 (×2): qty 90, 90d supply, fill #0
  Filled 2021-12-20: qty 90, 90d supply, fill #1

## 2021-09-25 ENCOUNTER — Other Ambulatory Visit (HOSPITAL_COMMUNITY): Payer: Self-pay

## 2021-09-26 ENCOUNTER — Other Ambulatory Visit (HOSPITAL_COMMUNITY): Payer: Self-pay

## 2021-10-07 ENCOUNTER — Other Ambulatory Visit (HOSPITAL_COMMUNITY): Payer: Self-pay

## 2021-10-09 ENCOUNTER — Other Ambulatory Visit (HOSPITAL_COMMUNITY): Payer: Self-pay

## 2021-10-09 DIAGNOSIS — M47816 Spondylosis without myelopathy or radiculopathy, lumbar region: Secondary | ICD-10-CM | POA: Diagnosis not present

## 2021-10-09 DIAGNOSIS — F1120 Opioid dependence, uncomplicated: Secondary | ICD-10-CM | POA: Diagnosis not present

## 2021-10-09 DIAGNOSIS — Z6826 Body mass index (BMI) 26.0-26.9, adult: Secondary | ICD-10-CM | POA: Diagnosis not present

## 2021-10-09 DIAGNOSIS — G5701 Lesion of sciatic nerve, right lower limb: Secondary | ICD-10-CM | POA: Diagnosis not present

## 2021-10-09 MED ORDER — OXYCODONE-ACETAMINOPHEN 5-325 MG PO TABS
1.0000 | ORAL_TABLET | Freq: Four times a day (QID) | ORAL | 0 refills | Status: DC | PRN
  Filled 2021-12-24: qty 90, 23d supply, fill #0

## 2021-10-09 MED ORDER — OXYCODONE-ACETAMINOPHEN 5-325 MG PO TABS
ORAL_TABLET | ORAL | 0 refills | Status: DC
  Filled 2021-10-25 – 2021-11-19 (×2): qty 90, fill #0
  Filled 2021-11-25: qty 90, 30d supply, fill #0

## 2021-10-09 MED ORDER — TIZANIDINE HCL 4 MG PO TABS
ORAL_TABLET | ORAL | 2 refills | Status: DC
  Filled 2021-10-09: qty 90, 30d supply, fill #0
  Filled 2021-11-09: qty 90, 30d supply, fill #1
  Filled 2021-12-09: qty 90, 30d supply, fill #2

## 2021-10-09 MED ORDER — OXYCODONE-ACETAMINOPHEN 5-325 MG PO TABS
ORAL_TABLET | ORAL | 0 refills | Status: DC
  Filled 2021-10-26: qty 90, 30d supply, fill #0

## 2021-10-26 ENCOUNTER — Other Ambulatory Visit (HOSPITAL_COMMUNITY): Payer: Self-pay

## 2021-10-27 ENCOUNTER — Other Ambulatory Visit (HOSPITAL_COMMUNITY): Payer: Self-pay

## 2021-10-29 DIAGNOSIS — Z4789 Encounter for other orthopedic aftercare: Secondary | ICD-10-CM | POA: Diagnosis not present

## 2021-11-04 DIAGNOSIS — D2261 Melanocytic nevi of right upper limb, including shoulder: Secondary | ICD-10-CM | POA: Diagnosis not present

## 2021-11-04 DIAGNOSIS — L814 Other melanin hyperpigmentation: Secondary | ICD-10-CM | POA: Diagnosis not present

## 2021-11-04 DIAGNOSIS — L570 Actinic keratosis: Secondary | ICD-10-CM | POA: Diagnosis not present

## 2021-11-04 DIAGNOSIS — L821 Other seborrheic keratosis: Secondary | ICD-10-CM | POA: Diagnosis not present

## 2021-11-04 DIAGNOSIS — D1801 Hemangioma of skin and subcutaneous tissue: Secondary | ICD-10-CM | POA: Diagnosis not present

## 2021-11-04 DIAGNOSIS — L738 Other specified follicular disorders: Secondary | ICD-10-CM | POA: Diagnosis not present

## 2021-11-09 ENCOUNTER — Other Ambulatory Visit (HOSPITAL_COMMUNITY): Payer: Self-pay

## 2021-11-10 ENCOUNTER — Other Ambulatory Visit (HOSPITAL_COMMUNITY): Payer: Self-pay

## 2021-11-12 ENCOUNTER — Other Ambulatory Visit (HOSPITAL_COMMUNITY): Payer: Self-pay

## 2021-11-19 ENCOUNTER — Other Ambulatory Visit (HOSPITAL_COMMUNITY): Payer: Self-pay

## 2021-11-24 ENCOUNTER — Other Ambulatory Visit (HOSPITAL_COMMUNITY): Payer: Self-pay

## 2021-11-24 MED ORDER — ONDANSETRON 8 MG PO TBDP
8.0000 mg | ORAL_TABLET | Freq: Three times a day (TID) | ORAL | 4 refills | Status: AC | PRN
  Filled 2021-11-24: qty 20, 7d supply, fill #0

## 2021-11-25 ENCOUNTER — Other Ambulatory Visit (HOSPITAL_COMMUNITY): Payer: Self-pay

## 2021-12-10 ENCOUNTER — Other Ambulatory Visit (HOSPITAL_COMMUNITY): Payer: Self-pay

## 2021-12-21 ENCOUNTER — Other Ambulatory Visit (HOSPITAL_COMMUNITY): Payer: Self-pay

## 2021-12-24 ENCOUNTER — Other Ambulatory Visit (HOSPITAL_COMMUNITY): Payer: Self-pay

## 2021-12-29 ENCOUNTER — Other Ambulatory Visit (HOSPITAL_COMMUNITY): Payer: Self-pay

## 2021-12-29 DIAGNOSIS — M47816 Spondylosis without myelopathy or radiculopathy, lumbar region: Secondary | ICD-10-CM | POA: Diagnosis not present

## 2021-12-29 DIAGNOSIS — G5701 Lesion of sciatic nerve, right lower limb: Secondary | ICD-10-CM | POA: Diagnosis not present

## 2021-12-29 DIAGNOSIS — F1120 Opioid dependence, uncomplicated: Secondary | ICD-10-CM | POA: Diagnosis not present

## 2021-12-29 MED ORDER — OXYCODONE-ACETAMINOPHEN 5-325 MG PO TABS
1.0000 | ORAL_TABLET | Freq: Four times a day (QID) | ORAL | 0 refills | Status: DC | PRN
  Filled 2022-03-21: qty 90, 23d supply, fill #0

## 2021-12-29 MED ORDER — OXYCODONE-ACETAMINOPHEN 5-325 MG PO TABS
1.0000 | ORAL_TABLET | Freq: Four times a day (QID) | ORAL | 0 refills | Status: DC | PRN
  Filled 2022-02-21: qty 90, 23d supply, fill #0

## 2021-12-29 MED ORDER — OXYCODONE-ACETAMINOPHEN 5-325 MG PO TABS
1.0000 | ORAL_TABLET | Freq: Four times a day (QID) | ORAL | 0 refills | Status: DC | PRN
  Filled 2022-01-23: qty 90, 23d supply, fill #0

## 2021-12-29 MED ORDER — TIZANIDINE HCL 4 MG PO TABS
4.0000 mg | ORAL_TABLET | Freq: Three times a day (TID) | ORAL | 11 refills | Status: DC | PRN
  Filled 2021-12-29 – 2022-01-07 (×2): qty 90, 30d supply, fill #0
  Filled 2022-02-09: qty 90, 30d supply, fill #1
  Filled 2022-02-21 – 2022-03-16 (×2): qty 90, 30d supply, fill #2
  Filled 2022-04-20: qty 90, 30d supply, fill #3
  Filled 2022-05-20: qty 90, 30d supply, fill #4
  Filled 2022-06-17: qty 90, 30d supply, fill #5
  Filled 2022-07-14: qty 90, 30d supply, fill #6
  Filled 2022-08-23: qty 90, 30d supply, fill #7
  Filled 2022-09-22: qty 90, 30d supply, fill #8
  Filled 2022-10-20: qty 90, 30d supply, fill #9
  Filled 2022-11-18: qty 90, 30d supply, fill #10
  Filled 2022-12-16: qty 90, 30d supply, fill #11

## 2022-01-08 ENCOUNTER — Other Ambulatory Visit (HOSPITAL_COMMUNITY): Payer: Self-pay

## 2022-01-08 MED ORDER — ZOLPIDEM TARTRATE 5 MG PO TABS
5.0000 mg | ORAL_TABLET | Freq: Every evening | ORAL | 1 refills | Status: DC | PRN
  Filled 2022-01-08: qty 90, 90d supply, fill #0
  Filled 2022-01-19: qty 90, fill #0
  Filled 2022-01-24 – 2022-03-21 (×2): qty 90, 90d supply, fill #0
  Filled 2022-04-29 – 2022-06-17 (×3): qty 90, 90d supply, fill #1

## 2022-01-20 ENCOUNTER — Other Ambulatory Visit (HOSPITAL_COMMUNITY): Payer: Self-pay

## 2022-01-21 ENCOUNTER — Other Ambulatory Visit (HOSPITAL_COMMUNITY): Payer: Self-pay

## 2022-01-22 ENCOUNTER — Other Ambulatory Visit (HOSPITAL_COMMUNITY): Payer: Self-pay

## 2022-01-23 ENCOUNTER — Other Ambulatory Visit (HOSPITAL_COMMUNITY): Payer: Self-pay

## 2022-01-25 ENCOUNTER — Other Ambulatory Visit (HOSPITAL_COMMUNITY): Payer: Self-pay

## 2022-01-25 MED ORDER — BUPROPION HCL ER (SR) 150 MG PO TB12
150.0000 mg | ORAL_TABLET | Freq: Every morning | ORAL | 3 refills | Status: DC
  Filled 2022-01-25 – 2022-04-18 (×5): qty 90, 90d supply, fill #0
  Filled 2022-07-14 – 2022-10-03 (×4): qty 90, 90d supply, fill #1
  Filled 2023-01-04: qty 90, 90d supply, fill #2

## 2022-01-29 ENCOUNTER — Other Ambulatory Visit (HOSPITAL_COMMUNITY): Payer: Self-pay

## 2022-02-01 ENCOUNTER — Other Ambulatory Visit (HOSPITAL_COMMUNITY): Payer: Self-pay

## 2022-02-01 MED ORDER — AMLODIPINE BESYLATE 5 MG PO TABS
5.0000 mg | ORAL_TABLET | Freq: Every day | ORAL | 3 refills | Status: DC
  Filled 2022-02-01: qty 90, 90d supply, fill #0
  Filled 2022-04-21 – 2022-04-22 (×3): qty 90, 90d supply, fill #1
  Filled 2022-08-01: qty 90, 90d supply, fill #2
  Filled 2022-11-11: qty 90, 90d supply, fill #3

## 2022-02-09 ENCOUNTER — Other Ambulatory Visit (HOSPITAL_COMMUNITY): Payer: Self-pay

## 2022-02-22 ENCOUNTER — Other Ambulatory Visit (HOSPITAL_COMMUNITY): Payer: Self-pay

## 2022-02-22 ENCOUNTER — Other Ambulatory Visit: Payer: Self-pay

## 2022-03-04 ENCOUNTER — Other Ambulatory Visit (HOSPITAL_COMMUNITY): Payer: Self-pay

## 2022-03-05 ENCOUNTER — Other Ambulatory Visit (HOSPITAL_COMMUNITY): Payer: Self-pay

## 2022-03-10 ENCOUNTER — Other Ambulatory Visit (HOSPITAL_COMMUNITY): Payer: Self-pay

## 2022-03-10 DIAGNOSIS — G5701 Lesion of sciatic nerve, right lower limb: Secondary | ICD-10-CM | POA: Diagnosis not present

## 2022-03-10 DIAGNOSIS — F1120 Opioid dependence, uncomplicated: Secondary | ICD-10-CM | POA: Diagnosis not present

## 2022-03-10 DIAGNOSIS — Z6826 Body mass index (BMI) 26.0-26.9, adult: Secondary | ICD-10-CM | POA: Diagnosis not present

## 2022-03-10 DIAGNOSIS — M47816 Spondylosis without myelopathy or radiculopathy, lumbar region: Secondary | ICD-10-CM | POA: Diagnosis not present

## 2022-03-10 MED ORDER — OXYCODONE-ACETAMINOPHEN 5-325 MG PO TABS
1.0000 | ORAL_TABLET | Freq: Four times a day (QID) | ORAL | 0 refills | Status: DC | PRN
  Filled 2022-05-20: qty 90, 23d supply, fill #0

## 2022-03-10 MED ORDER — OXYCODONE-ACETAMINOPHEN 5-325 MG PO TABS
1.0000 | ORAL_TABLET | Freq: Four times a day (QID) | ORAL | 0 refills | Status: DC | PRN
  Filled 2022-04-22 (×2): qty 90, 23d supply, fill #0

## 2022-03-22 ENCOUNTER — Other Ambulatory Visit (HOSPITAL_COMMUNITY): Payer: Self-pay

## 2022-03-22 ENCOUNTER — Other Ambulatory Visit: Payer: Self-pay

## 2022-04-18 ENCOUNTER — Other Ambulatory Visit (HOSPITAL_COMMUNITY): Payer: Self-pay

## 2022-04-19 ENCOUNTER — Other Ambulatory Visit: Payer: Self-pay

## 2022-04-19 ENCOUNTER — Other Ambulatory Visit (HOSPITAL_COMMUNITY): Payer: Self-pay

## 2022-04-20 ENCOUNTER — Other Ambulatory Visit (HOSPITAL_COMMUNITY): Payer: Self-pay

## 2022-04-20 MED ORDER — BUPROPION HCL ER (SR) 150 MG PO TB12
ORAL_TABLET | ORAL | 3 refills | Status: DC
  Filled 2022-04-20 – 2022-07-14 (×5): qty 90, 90d supply, fill #0
  Filled 2022-10-09 – 2023-01-11 (×5): qty 90, 90d supply, fill #1

## 2022-04-21 ENCOUNTER — Other Ambulatory Visit (HOSPITAL_COMMUNITY): Payer: Self-pay

## 2022-04-22 ENCOUNTER — Other Ambulatory Visit: Payer: Self-pay

## 2022-04-22 ENCOUNTER — Other Ambulatory Visit (HOSPITAL_COMMUNITY): Payer: Self-pay

## 2022-04-30 ENCOUNTER — Other Ambulatory Visit (HOSPITAL_COMMUNITY): Payer: Self-pay

## 2022-05-20 ENCOUNTER — Other Ambulatory Visit (HOSPITAL_COMMUNITY): Payer: Self-pay

## 2022-05-20 MED ORDER — DESVENLAFAXINE SUCCINATE ER 50 MG PO TB24
50.0000 mg | ORAL_TABLET | Freq: Every day | ORAL | 11 refills | Status: DC
  Filled 2022-05-20: qty 30, 30d supply, fill #0
  Filled 2022-06-17: qty 30, 30d supply, fill #1

## 2022-05-21 ENCOUNTER — Other Ambulatory Visit (HOSPITAL_COMMUNITY): Payer: Self-pay

## 2022-05-21 ENCOUNTER — Other Ambulatory Visit: Payer: Self-pay

## 2022-06-02 ENCOUNTER — Other Ambulatory Visit (HOSPITAL_COMMUNITY): Payer: Self-pay

## 2022-06-02 DIAGNOSIS — M47816 Spondylosis without myelopathy or radiculopathy, lumbar region: Secondary | ICD-10-CM | POA: Diagnosis not present

## 2022-06-02 DIAGNOSIS — G5701 Lesion of sciatic nerve, right lower limb: Secondary | ICD-10-CM | POA: Diagnosis not present

## 2022-06-02 DIAGNOSIS — Z6826 Body mass index (BMI) 26.0-26.9, adult: Secondary | ICD-10-CM | POA: Diagnosis not present

## 2022-06-02 DIAGNOSIS — F1120 Opioid dependence, uncomplicated: Secondary | ICD-10-CM | POA: Diagnosis not present

## 2022-06-02 MED ORDER — OXYCODONE-ACETAMINOPHEN 5-325 MG PO TABS
1.0000 | ORAL_TABLET | Freq: Four times a day (QID) | ORAL | 0 refills | Status: DC | PRN
  Filled 2022-08-19: qty 90, 23d supply, fill #0

## 2022-06-02 MED ORDER — OXYCODONE-ACETAMINOPHEN 5-325 MG PO TABS
1.0000 | ORAL_TABLET | Freq: Four times a day (QID) | ORAL | 0 refills | Status: DC | PRN
  Filled 2022-06-17: qty 90, 23d supply, fill #0

## 2022-06-02 MED ORDER — OXYCODONE-ACETAMINOPHEN 5-325 MG PO TABS
1.0000 | ORAL_TABLET | Freq: Four times a day (QID) | ORAL | 0 refills | Status: DC | PRN
  Filled 2022-07-18: qty 90, 23d supply, fill #0

## 2022-06-04 ENCOUNTER — Other Ambulatory Visit (HOSPITAL_COMMUNITY): Payer: Self-pay

## 2022-06-14 ENCOUNTER — Other Ambulatory Visit (HOSPITAL_COMMUNITY): Payer: Self-pay

## 2022-06-17 ENCOUNTER — Other Ambulatory Visit (HOSPITAL_COMMUNITY): Payer: Self-pay

## 2022-06-18 ENCOUNTER — Other Ambulatory Visit (HOSPITAL_COMMUNITY): Payer: Self-pay

## 2022-06-18 ENCOUNTER — Other Ambulatory Visit: Payer: Self-pay

## 2022-06-21 DIAGNOSIS — K219 Gastro-esophageal reflux disease without esophagitis: Secondary | ICD-10-CM | POA: Diagnosis not present

## 2022-06-21 DIAGNOSIS — M1990 Unspecified osteoarthritis, unspecified site: Secondary | ICD-10-CM | POA: Diagnosis not present

## 2022-06-21 DIAGNOSIS — R7301 Impaired fasting glucose: Secondary | ICD-10-CM | POA: Diagnosis not present

## 2022-06-21 DIAGNOSIS — I10 Essential (primary) hypertension: Secondary | ICD-10-CM | POA: Diagnosis not present

## 2022-06-21 DIAGNOSIS — R7989 Other specified abnormal findings of blood chemistry: Secondary | ICD-10-CM | POA: Diagnosis not present

## 2022-06-21 DIAGNOSIS — E785 Hyperlipidemia, unspecified: Secondary | ICD-10-CM | POA: Diagnosis not present

## 2022-06-28 ENCOUNTER — Other Ambulatory Visit (HOSPITAL_COMMUNITY): Payer: Self-pay

## 2022-06-28 DIAGNOSIS — J309 Allergic rhinitis, unspecified: Secondary | ICD-10-CM | POA: Diagnosis not present

## 2022-06-28 DIAGNOSIS — I10 Essential (primary) hypertension: Secondary | ICD-10-CM | POA: Diagnosis not present

## 2022-06-28 DIAGNOSIS — Z0000 Encounter for general adult medical examination without abnormal findings: Secondary | ICD-10-CM | POA: Diagnosis not present

## 2022-06-28 DIAGNOSIS — R82998 Other abnormal findings in urine: Secondary | ICD-10-CM | POA: Diagnosis not present

## 2022-06-28 DIAGNOSIS — G6289 Other specified polyneuropathies: Secondary | ICD-10-CM | POA: Diagnosis not present

## 2022-06-28 DIAGNOSIS — E876 Hypokalemia: Secondary | ICD-10-CM | POA: Diagnosis not present

## 2022-06-28 DIAGNOSIS — E538 Deficiency of other specified B group vitamins: Secondary | ICD-10-CM | POA: Diagnosis not present

## 2022-06-28 DIAGNOSIS — R946 Abnormal results of thyroid function studies: Secondary | ICD-10-CM | POA: Diagnosis not present

## 2022-06-28 DIAGNOSIS — E785 Hyperlipidemia, unspecified: Secondary | ICD-10-CM | POA: Diagnosis not present

## 2022-06-28 DIAGNOSIS — K5900 Constipation, unspecified: Secondary | ICD-10-CM | POA: Diagnosis not present

## 2022-06-28 DIAGNOSIS — I700 Atherosclerosis of aorta: Secondary | ICD-10-CM | POA: Diagnosis not present

## 2022-06-28 DIAGNOSIS — F329 Major depressive disorder, single episode, unspecified: Secondary | ICD-10-CM | POA: Diagnosis not present

## 2022-06-28 MED ORDER — ERYTHROMYCIN 2 % EX GEL
CUTANEOUS | 5 refills | Status: DC
  Filled 2022-06-28: qty 30, 30d supply, fill #0

## 2022-06-29 ENCOUNTER — Other Ambulatory Visit (HOSPITAL_COMMUNITY): Payer: Self-pay

## 2022-06-29 MED ORDER — DESVENLAFAXINE SUCCINATE ER 100 MG PO TB24
100.0000 mg | ORAL_TABLET | Freq: Every day | ORAL | 3 refills | Status: DC
  Filled 2022-06-29: qty 90, 90d supply, fill #0
  Filled 2022-10-03: qty 90, 90d supply, fill #1
  Filled 2023-01-04: qty 90, 90d supply, fill #2
  Filled 2023-03-31: qty 90, 90d supply, fill #3

## 2022-07-14 ENCOUNTER — Other Ambulatory Visit (HOSPITAL_COMMUNITY): Payer: Self-pay

## 2022-07-15 ENCOUNTER — Other Ambulatory Visit (HOSPITAL_COMMUNITY): Payer: Self-pay

## 2022-07-15 ENCOUNTER — Other Ambulatory Visit: Payer: Self-pay

## 2022-07-15 MED ORDER — EZETIMIBE 10 MG PO TABS
10.0000 mg | ORAL_TABLET | Freq: Every day | ORAL | 2 refills | Status: DC
  Filled 2022-07-15: qty 90, 90d supply, fill #0
  Filled 2022-10-15: qty 90, 90d supply, fill #1
  Filled 2023-01-11: qty 90, 90d supply, fill #2

## 2022-07-15 MED ORDER — PANTOPRAZOLE SODIUM 40 MG PO TBEC
40.0000 mg | DELAYED_RELEASE_TABLET | Freq: Every day | ORAL | 2 refills | Status: DC
  Filled 2022-07-15: qty 90, 90d supply, fill #0
  Filled 2022-10-15: qty 90, 90d supply, fill #1
  Filled 2023-01-11: qty 90, 90d supply, fill #2

## 2022-07-15 MED ORDER — ZOLPIDEM TARTRATE 5 MG PO TABS
5.0000 mg | ORAL_TABLET | Freq: Every evening | ORAL | 1 refills | Status: DC | PRN
  Filled 2022-07-15 – 2022-08-23 (×2): qty 90, 90d supply, fill #0
  Filled 2022-09-05: qty 90, fill #0
  Filled 2022-09-14: qty 90, 90d supply, fill #0
  Filled 2022-12-05 – 2022-12-10 (×2): qty 90, 90d supply, fill #1

## 2022-07-15 MED ORDER — TELMISARTAN 20 MG PO TABS
20.0000 mg | ORAL_TABLET | Freq: Two times a day (BID) | ORAL | 2 refills | Status: DC
  Filled 2022-07-15: qty 180, 90d supply, fill #0
  Filled 2022-10-15: qty 180, 90d supply, fill #1
  Filled 2023-01-11: qty 180, 90d supply, fill #2

## 2022-07-16 ENCOUNTER — Other Ambulatory Visit (HOSPITAL_COMMUNITY): Payer: Self-pay

## 2022-07-16 ENCOUNTER — Other Ambulatory Visit: Payer: Self-pay

## 2022-07-19 ENCOUNTER — Other Ambulatory Visit: Payer: Self-pay

## 2022-07-19 ENCOUNTER — Other Ambulatory Visit (HOSPITAL_COMMUNITY): Payer: Self-pay

## 2022-07-20 DIAGNOSIS — I10 Essential (primary) hypertension: Secondary | ICD-10-CM | POA: Diagnosis not present

## 2022-07-20 DIAGNOSIS — E785 Hyperlipidemia, unspecified: Secondary | ICD-10-CM | POA: Diagnosis not present

## 2022-08-06 ENCOUNTER — Other Ambulatory Visit (HOSPITAL_COMMUNITY): Payer: Self-pay

## 2022-08-06 DIAGNOSIS — K645 Perianal venous thrombosis: Secondary | ICD-10-CM | POA: Diagnosis not present

## 2022-08-14 ENCOUNTER — Other Ambulatory Visit (HOSPITAL_COMMUNITY): Payer: Self-pay

## 2022-08-16 ENCOUNTER — Other Ambulatory Visit (HOSPITAL_COMMUNITY): Payer: Self-pay

## 2022-08-16 MED ORDER — PRALUENT 150 MG/ML ~~LOC~~ SOAJ
150.0000 mg | SUBCUTANEOUS | 3 refills | Status: DC
  Filled 2022-08-16 – 2022-09-05 (×3): qty 6, 84d supply, fill #0
  Filled 2022-11-12: qty 6, 84d supply, fill #1
  Filled 2023-02-18: qty 6, 84d supply, fill #2
  Filled 2023-05-13: qty 6, 84d supply, fill #3

## 2022-08-16 MED ORDER — LABETALOL HCL 200 MG PO TABS
200.0000 mg | ORAL_TABLET | Freq: Two times a day (BID) | ORAL | 3 refills | Status: DC
  Filled 2022-08-16: qty 180, 90d supply, fill #0

## 2022-08-17 ENCOUNTER — Other Ambulatory Visit (HOSPITAL_COMMUNITY): Payer: Self-pay

## 2022-08-18 ENCOUNTER — Other Ambulatory Visit: Payer: Self-pay

## 2022-08-18 ENCOUNTER — Other Ambulatory Visit (HOSPITAL_COMMUNITY): Payer: Self-pay

## 2022-08-19 ENCOUNTER — Other Ambulatory Visit (HOSPITAL_COMMUNITY): Payer: Self-pay

## 2022-08-23 ENCOUNTER — Other Ambulatory Visit: Payer: Self-pay

## 2022-08-23 ENCOUNTER — Other Ambulatory Visit (HOSPITAL_COMMUNITY): Payer: Self-pay

## 2022-09-01 ENCOUNTER — Other Ambulatory Visit (HOSPITAL_COMMUNITY): Payer: Self-pay

## 2022-09-01 DIAGNOSIS — F1120 Opioid dependence, uncomplicated: Secondary | ICD-10-CM | POA: Diagnosis not present

## 2022-09-01 DIAGNOSIS — G5701 Lesion of sciatic nerve, right lower limb: Secondary | ICD-10-CM | POA: Diagnosis not present

## 2022-09-01 DIAGNOSIS — Z6825 Body mass index (BMI) 25.0-25.9, adult: Secondary | ICD-10-CM | POA: Diagnosis not present

## 2022-09-01 MED ORDER — OXYCODONE-ACETAMINOPHEN 5-325 MG PO TABS
1.0000 | ORAL_TABLET | Freq: Four times a day (QID) | ORAL | 0 refills | Status: DC | PRN
  Filled 2022-10-17: qty 90, 23d supply, fill #0

## 2022-09-01 MED ORDER — OXYCODONE-ACETAMINOPHEN 5-325 MG PO TABS
1.0000 | ORAL_TABLET | Freq: Four times a day (QID) | ORAL | 0 refills | Status: DC | PRN
  Filled 2022-11-17: qty 90, 23d supply, fill #0

## 2022-09-01 MED ORDER — OXYCODONE-ACETAMINOPHEN 5-325 MG PO TABS
1.0000 | ORAL_TABLET | Freq: Four times a day (QID) | ORAL | 0 refills | Status: DC | PRN
  Filled 2022-09-17: qty 90, 23d supply, fill #0

## 2022-09-06 ENCOUNTER — Other Ambulatory Visit: Payer: Self-pay

## 2022-09-06 ENCOUNTER — Other Ambulatory Visit (HOSPITAL_COMMUNITY): Payer: Self-pay

## 2022-09-07 ENCOUNTER — Other Ambulatory Visit (HOSPITAL_COMMUNITY): Payer: Self-pay

## 2022-09-07 ENCOUNTER — Other Ambulatory Visit: Payer: Self-pay

## 2022-09-14 ENCOUNTER — Other Ambulatory Visit (HOSPITAL_COMMUNITY): Payer: Self-pay

## 2022-09-14 ENCOUNTER — Other Ambulatory Visit: Payer: Self-pay

## 2022-09-17 ENCOUNTER — Other Ambulatory Visit (HOSPITAL_COMMUNITY): Payer: Self-pay

## 2022-09-17 ENCOUNTER — Other Ambulatory Visit: Payer: Self-pay

## 2022-09-23 ENCOUNTER — Other Ambulatory Visit: Payer: Self-pay

## 2022-09-23 ENCOUNTER — Other Ambulatory Visit (HOSPITAL_COMMUNITY): Payer: Self-pay

## 2022-10-04 ENCOUNTER — Other Ambulatory Visit (HOSPITAL_COMMUNITY): Payer: Self-pay

## 2022-10-09 ENCOUNTER — Other Ambulatory Visit (HOSPITAL_COMMUNITY): Payer: Self-pay

## 2022-10-11 ENCOUNTER — Other Ambulatory Visit (HOSPITAL_COMMUNITY): Payer: Self-pay

## 2022-10-15 ENCOUNTER — Other Ambulatory Visit (HOSPITAL_COMMUNITY): Payer: Self-pay

## 2022-10-17 ENCOUNTER — Other Ambulatory Visit: Payer: Self-pay

## 2022-10-18 ENCOUNTER — Other Ambulatory Visit (HOSPITAL_COMMUNITY): Payer: Self-pay

## 2022-10-22 ENCOUNTER — Other Ambulatory Visit (HOSPITAL_COMMUNITY): Payer: Self-pay

## 2022-10-27 DIAGNOSIS — M25511 Pain in right shoulder: Secondary | ICD-10-CM | POA: Diagnosis not present

## 2022-11-10 DIAGNOSIS — I10 Essential (primary) hypertension: Secondary | ICD-10-CM | POA: Diagnosis not present

## 2022-11-11 ENCOUNTER — Other Ambulatory Visit (HOSPITAL_COMMUNITY): Payer: Self-pay

## 2022-11-12 ENCOUNTER — Other Ambulatory Visit (HOSPITAL_COMMUNITY): Payer: Self-pay

## 2022-11-15 ENCOUNTER — Other Ambulatory Visit (HOSPITAL_COMMUNITY): Payer: Self-pay

## 2022-11-15 DIAGNOSIS — I10 Essential (primary) hypertension: Secondary | ICD-10-CM | POA: Diagnosis not present

## 2022-11-17 ENCOUNTER — Other Ambulatory Visit (HOSPITAL_COMMUNITY): Payer: Self-pay

## 2022-11-17 MED ORDER — LABETALOL HCL 300 MG PO TABS
300.0000 mg | ORAL_TABLET | Freq: Two times a day (BID) | ORAL | 3 refills | Status: DC
  Filled 2022-11-17: qty 180, 90d supply, fill #0
  Filled 2023-02-11: qty 180, 90d supply, fill #1
  Filled 2023-05-12: qty 180, 90d supply, fill #2
  Filled 2023-08-13: qty 180, 90d supply, fill #3

## 2022-11-18 ENCOUNTER — Other Ambulatory Visit (HOSPITAL_COMMUNITY): Payer: Self-pay

## 2022-11-23 ENCOUNTER — Other Ambulatory Visit (HOSPITAL_COMMUNITY): Payer: Self-pay

## 2022-12-06 ENCOUNTER — Other Ambulatory Visit: Payer: Self-pay

## 2022-12-06 ENCOUNTER — Other Ambulatory Visit (HOSPITAL_COMMUNITY): Payer: Self-pay

## 2022-12-08 DIAGNOSIS — M7541 Impingement syndrome of right shoulder: Secondary | ICD-10-CM | POA: Diagnosis not present

## 2022-12-09 ENCOUNTER — Other Ambulatory Visit (HOSPITAL_COMMUNITY): Payer: Self-pay

## 2022-12-09 DIAGNOSIS — M47816 Spondylosis without myelopathy or radiculopathy, lumbar region: Secondary | ICD-10-CM | POA: Diagnosis not present

## 2022-12-09 DIAGNOSIS — F1120 Opioid dependence, uncomplicated: Secondary | ICD-10-CM | POA: Diagnosis not present

## 2022-12-09 DIAGNOSIS — Z6826 Body mass index (BMI) 26.0-26.9, adult: Secondary | ICD-10-CM | POA: Diagnosis not present

## 2022-12-09 DIAGNOSIS — G5701 Lesion of sciatic nerve, right lower limb: Secondary | ICD-10-CM | POA: Diagnosis not present

## 2022-12-09 MED ORDER — OXYCODONE-ACETAMINOPHEN 5-325 MG PO TABS
1.0000 | ORAL_TABLET | Freq: Four times a day (QID) | ORAL | 0 refills | Status: DC
  Filled 2023-01-20: qty 90, 23d supply, fill #0

## 2022-12-09 MED ORDER — OXYCODONE-ACETAMINOPHEN 5-325 MG PO TABS
1.0000 | ORAL_TABLET | Freq: Four times a day (QID) | ORAL | 0 refills | Status: DC
  Filled 2022-12-20: qty 90, 23d supply, fill #0

## 2022-12-09 MED ORDER — OXYCODONE-ACETAMINOPHEN 5-325 MG PO TABS
1.0000 | ORAL_TABLET | Freq: Four times a day (QID) | ORAL | 0 refills | Status: DC
  Filled 2023-01-16 – 2023-02-14 (×3): qty 90, 23d supply, fill #0

## 2022-12-10 ENCOUNTER — Other Ambulatory Visit: Payer: Self-pay

## 2022-12-20 ENCOUNTER — Other Ambulatory Visit (HOSPITAL_COMMUNITY): Payer: Self-pay

## 2022-12-25 DIAGNOSIS — M25511 Pain in right shoulder: Secondary | ICD-10-CM | POA: Diagnosis not present

## 2023-01-04 ENCOUNTER — Other Ambulatory Visit (HOSPITAL_COMMUNITY): Payer: Self-pay

## 2023-01-04 DIAGNOSIS — I700 Atherosclerosis of aorta: Secondary | ICD-10-CM | POA: Diagnosis not present

## 2023-01-04 DIAGNOSIS — E876 Hypokalemia: Secondary | ICD-10-CM | POA: Diagnosis not present

## 2023-01-04 DIAGNOSIS — E785 Hyperlipidemia, unspecified: Secondary | ICD-10-CM | POA: Diagnosis not present

## 2023-01-04 DIAGNOSIS — E538 Deficiency of other specified B group vitamins: Secondary | ICD-10-CM | POA: Diagnosis not present

## 2023-01-04 DIAGNOSIS — I10 Essential (primary) hypertension: Secondary | ICD-10-CM | POA: Diagnosis not present

## 2023-01-04 DIAGNOSIS — Z7689 Persons encountering health services in other specified circumstances: Secondary | ICD-10-CM | POA: Diagnosis not present

## 2023-01-04 DIAGNOSIS — J309 Allergic rhinitis, unspecified: Secondary | ICD-10-CM | POA: Diagnosis not present

## 2023-01-04 DIAGNOSIS — G6289 Other specified polyneuropathies: Secondary | ICD-10-CM | POA: Diagnosis not present

## 2023-01-04 DIAGNOSIS — M1990 Unspecified osteoarthritis, unspecified site: Secondary | ICD-10-CM | POA: Diagnosis not present

## 2023-01-04 DIAGNOSIS — R7301 Impaired fasting glucose: Secondary | ICD-10-CM | POA: Diagnosis not present

## 2023-01-04 DIAGNOSIS — F329 Major depressive disorder, single episode, unspecified: Secondary | ICD-10-CM | POA: Diagnosis not present

## 2023-01-04 MED ORDER — HYDROCHLOROTHIAZIDE 25 MG PO TABS
25.0000 mg | ORAL_TABLET | Freq: Every evening | ORAL | 3 refills | Status: DC
  Filled 2023-01-04: qty 90, 90d supply, fill #0
  Filled 2023-03-31: qty 90, 90d supply, fill #1
  Filled 2023-06-29: qty 90, 90d supply, fill #2
  Filled 2023-10-03: qty 90, 90d supply, fill #3

## 2023-01-05 ENCOUNTER — Other Ambulatory Visit (HOSPITAL_COMMUNITY): Payer: Self-pay

## 2023-01-05 ENCOUNTER — Other Ambulatory Visit: Payer: Self-pay

## 2023-01-12 ENCOUNTER — Other Ambulatory Visit (HOSPITAL_COMMUNITY): Payer: Self-pay

## 2023-01-12 ENCOUNTER — Other Ambulatory Visit: Payer: Self-pay

## 2023-01-16 ENCOUNTER — Other Ambulatory Visit (HOSPITAL_COMMUNITY): Payer: Self-pay

## 2023-01-17 ENCOUNTER — Other Ambulatory Visit (HOSPITAL_COMMUNITY): Payer: Self-pay

## 2023-01-17 DIAGNOSIS — M75101 Unspecified rotator cuff tear or rupture of right shoulder, not specified as traumatic: Secondary | ICD-10-CM | POA: Diagnosis not present

## 2023-01-17 MED ORDER — TIZANIDINE HCL 4 MG PO TABS
ORAL_TABLET | ORAL | 11 refills | Status: DC
  Filled 2023-01-17: qty 90, 30d supply, fill #0
  Filled 2023-02-11: qty 90, 30d supply, fill #1
  Filled 2023-03-14: qty 90, 30d supply, fill #2
  Filled 2023-04-12: qty 90, 30d supply, fill #3
  Filled 2023-05-07: qty 90, 30d supply, fill #4
  Filled 2023-06-08: qty 90, 30d supply, fill #5
  Filled 2023-07-06: qty 90, 30d supply, fill #6
  Filled 2023-08-13 (×2): qty 90, 30d supply, fill #7
  Filled 2023-09-07: qty 90, 30d supply, fill #8
  Filled 2023-10-10: qty 90, 30d supply, fill #9
  Filled 2023-11-07: qty 90, 30d supply, fill #10
  Filled 2023-12-09: qty 90, 30d supply, fill #11

## 2023-01-20 ENCOUNTER — Other Ambulatory Visit (HOSPITAL_COMMUNITY): Payer: Self-pay

## 2023-01-20 ENCOUNTER — Other Ambulatory Visit: Payer: Self-pay

## 2023-01-22 ENCOUNTER — Other Ambulatory Visit (HOSPITAL_COMMUNITY): Payer: Self-pay

## 2023-01-24 ENCOUNTER — Encounter (HOSPITAL_COMMUNITY): Payer: Self-pay

## 2023-01-24 ENCOUNTER — Other Ambulatory Visit: Payer: Self-pay

## 2023-01-24 ENCOUNTER — Encounter (HOSPITAL_COMMUNITY)
Admission: RE | Admit: 2023-01-24 | Discharge: 2023-01-24 | Disposition: A | Discharge status: Home / Self Care | Payer: Commercial Managed Care - PPO | Source: Ambulatory Visit | Attending: Orthopedic Surgery | Admitting: Orthopedic Surgery

## 2023-01-24 VITALS — BP 138/99 | HR 77 | Temp 98.7°F | Resp 16 | Ht 71.0 in | Wt 188.0 lb

## 2023-01-24 DIAGNOSIS — I10 Essential (primary) hypertension: Secondary | ICD-10-CM | POA: Insufficient documentation

## 2023-01-24 DIAGNOSIS — Z01818 Encounter for other preprocedural examination: Secondary | ICD-10-CM | POA: Diagnosis not present

## 2023-01-24 DIAGNOSIS — Z01812 Encounter for preprocedural laboratory examination: Secondary | ICD-10-CM | POA: Diagnosis present

## 2023-01-24 DIAGNOSIS — Z01810 Encounter for preprocedural cardiovascular examination: Secondary | ICD-10-CM | POA: Diagnosis present

## 2023-01-24 HISTORY — DX: Gastro-esophageal reflux disease without esophagitis: K21.9

## 2023-01-24 LAB — BASIC METABOLIC PANEL
Anion gap: 13 (ref 5–15)
BUN: 17 mg/dL (ref 6–20)
CO2: 29 mmol/L (ref 22–32)
Calcium: 9.5 mg/dL (ref 8.9–10.3)
Chloride: 97 mmol/L — ABNORMAL LOW (ref 98–111)
Creatinine, Ser: 1.23 mg/dL (ref 0.61–1.24)
GFR, Estimated: >60 mL/min (ref >60)
Glucose, Bld: 97 mg/dL (ref 70–99)
Potassium: 2.7 mmol/L — CL (ref 3.5–5.1)
Sodium: 139 mmol/L (ref 135–145)

## 2023-01-24 LAB — CBC
HCT: 35.4 % — ABNORMAL LOW (ref 39.0–52.0)
Hemoglobin: 12.4 g/dL — ABNORMAL LOW (ref 13.0–17.0)
MCH: 30 pg (ref 26.0–34.0)
MCHC: 35 g/dL (ref 30.0–36.0)
MCV: 85.7 fL (ref 80.0–100.0)
Platelets: 236 10*3/uL (ref 150–400)
RBC: 4.13 MIL/uL — ABNORMAL LOW (ref 4.22–5.81)
RDW: 12.6 % (ref 11.5–15.5)
WBC: 7.4 10*3/uL (ref 4.0–10.5)
nRBC: 0 % (ref 0.0–0.2)

## 2023-01-24 NOTE — Patient Instructions (Addendum)
SURGICAL WAITING ROOM VISITATION  Patients having surgery or a procedure may have no more than 2 support people in the waiting area - these visitors may rotate.    Children under the age of 55 must have an adult with them who is not the patient.  Due to an increase in RSV and influenza rates and associated hospitalizations, children ages 73 and under may not visit patients in [facility] hospitals.  If the patient needs to stay at the hospital during part of their recovery, the visitor guidelines for inpatient rooms apply. Pre-op nurse will coordinate an appropriate time for 1 support person to accompany patient in pre-op.  This support person may not rotate.    Please refer to the [facility] website for the visitor guidelines for Inpatients (after your surgery is over and you are in a regular room).    Your procedure is scheduled on: 01/27/23   Report to [facility] Main Entrance    Report to admitting at 10:10 AM   Call this number if you have problems the morning of surgery [phone]   Do not eat food :After Midnight.   After Midnight you may have the following liquids until 9:20 AM DAY OF SURGERY  Water Non-Citrus Juices (without pulp, NO RED-Apple, White grape, White cranberry) Black Coffee (NO MILK/CREAM OR CREAMERS, sugar ok)  Clear Tea (NO MILK/CREAM OR CREAMERS, sugar ok) regular and decaf                             Plain Jell-O (NO RED)                                           Fruit ices (not with fruit pulp, NO RED)                                     Popsicles (NO RED)                                                               Sports drinks like Gatorade (NO RED)                The day of surgery:  Drink ONE (1) Pre-Surgery Clear Ensure at 9:20 AM the morning of surgery. Drink in one sitting. Do not sip.  This drink was given to you during your hospital  pre-op appointment visit. Nothing else to drink after completing the  Pre-Surgery Clear  Ensure.          If you have questions, please contact your surgeon's office.   FOLLOW BOWEL PREP AND ANY ADDITIONAL PRE OP INSTRUCTIONS YOU RECEIVED FROM YOUR SURGEON'S OFFICE!!!     Oral Hygiene is also important to reduce your risk of infection.                                    Remember - BRUSH YOUR TEETH THE MORNING OF SURGERY WITH YOUR REGULAR TOOTHPASTE  DENTURES WILL BE  REMOVED PRIOR TO SURGERY PLEASE DO NOT APPLY "Poly grip" OR ADHESIVES!!!   Stop all vitamins and herbal supplements 7 days before surgery.   Take these medicines the morning of surgery with A SIP OF WATER: Labetalol, Zofran, Percocet                               You may not have any metal on your body including jewelry, and body piercing             Do not wear lotions, powders, cologne, or deodorant              Men may shave face and neck.   Do not bring valuables to the hospital. [facility] IS NOT             RESPONSIBLE   FOR VALUABLES.   Contacts, glasses, dentures or bridgework may not be worn into surgery.  DO NOT BRING YOUR HOME MEDICATIONS TO THE HOSPITAL. PHARMACY WILL DISPENSE MEDICATIONS LISTED ON YOUR MEDICATION LIST TO YOU DURING YOUR ADMISSION IN THE HOSPITAL!    Patients discharged on the day of surgery will not be allowed to drive home.  Someone NEEDS to stay with you for the first 24 hours after anesthesia.              Please read over the following fact sheets you were given: IF YOU HAVE QUESTIONS ABOUT YOUR PRE-OP INSTRUCTIONS PLEASE CALL [phone] Contras   If you received a COVID test during your pre-op visit  it is requested that you wear a mask when out in public, stay away from anyone that may not be feeling well and notify your surgeon if you develop symptoms. If you test positive for Covid or have been in contact with anyone that has tested positive in the last 10 days please notify you surgeon.    [facility] - Preparing for Surgery Before surgery, you can play an  important role.  Because skin is not sterile, your skin needs to be as free of germs as possible.  You can reduce the number of germs on your skin by washing with CHG (chlorahexidine gluconate) soap before surgery.  CHG is an antiseptic cleaner which kills germs and bonds with the skin to continue killing germs even after washing. Please DO NOT use if you have an allergy to CHG or antibacterial soaps.  If your skin becomes reddened/irritated stop using the CHG and inform your nurse when you arrive at Short Stay. Do not shave (including legs and underarms) for at least 48 hours prior to the first CHG shower.  You may shave your face/neck.  Please follow these instructions carefully:  1.  Shower with CHG Soap the night before surgery and the  morning of surgery.  2.  If you choose to wash your hair, wash your hair first as usual with your normal  shampoo.  3.  After you shampoo, rinse your hair and body thoroughly to remove the shampoo.                             4.  Use CHG as you would any other liquid soap.  You can apply chg directly to the skin and wash.  Gently with a scrungie or clean washcloth.  5.  Apply the CHG Soap to your body ONLY FROM THE NECK DOWN.   Do  not use on face/ open                           Wound or open sores. Avoid contact with eyes, ears mouth and   genitals (private parts).                       Wash face,  Genitals (private parts) with your normal soap.             6.  Wash thoroughly, paying special attention to the area where your    surgery  will be performed.  7.  Thoroughly rinse your body with warm water from the neck down.  8.  DO NOT shower/wash with your normal soap after using and rinsing off the CHG Soap.                9.  Pat yourself dry with a clean towel.            10.  Wear clean pajamas.            11.  Place clean sheets on your bed the night of your first shower and do not  sleep with pets. Day of Surgery : Do not apply any lotions/deodorants the  morning of surgery.  Please wear clean clothes to the hospital/surgery center.  FAILURE TO FOLLOW THESE INSTRUCTIONS MAY RESULT IN THE CANCELLATION OF YOUR SURGERY  PATIENT SIGNATURE_________________________________  NURSE SIGNATURE__________________________________  ________________________________________________________________________  Rogelia Mire  An incentive spirometer is a tool that can help keep your lungs clear and active. This tool measures how well you are filling your lungs with each breath. Taking long deep breaths may help reverse or decrease the chance of developing breathing (pulmonary) problems (especially infection) following: A long period of time when you are unable to move or be active. BEFORE THE PROCEDURE  If the spirometer includes an indicator to show your best effort, your nurse or respiratory therapist will set it to a desired goal. If possible, sit up straight or lean slightly forward. Try not to slouch. Hold the incentive spirometer in an upright position. INSTRUCTIONS FOR USE  Sit on the edge of your bed if possible, or sit up as far as you can in bed or on a chair. Hold the incentive spirometer in an upright position. Breathe out normally. Place the mouthpiece in your mouth and seal your lips tightly around it. Breathe in slowly and as deeply as possible, raising the piston or the ball toward the top of the column. Hold your breath for 3-5 seconds or for as long as possible. Allow the piston or ball to fall to the bottom of the column. Remove the mouthpiece from your mouth and breathe out normally. Rest for a few seconds and repeat Steps 1 through 7 at least 10 times every 1-2 hours when you are awake. Take your time and take a few normal breaths between deep breaths. The spirometer may include an indicator to show your best effort. Use the indicator as a goal to work toward during each repetition. After each set of 10 deep breaths, practice  coughing to be sure your lungs are clear. If you have an incision (the cut made at the time of surgery), support your incision when coughing by placing a pillow or rolled up towels firmly against it. Once you are able to get out of bed, walk around indoors and cough well. You may stop  using the incentive spirometer when instructed by your caregiver.  RISKS AND COMPLICATIONS Take your time so you do not get dizzy or light-headed. If you are in pain, you may need to take or ask for pain medication before doing incentive spirometry. It is harder to take a deep breath if you are having pain. AFTER USE Rest and breathe slowly and easily. It can be helpful to keep track of a log of your progress. Your caregiver can provide you with a simple table to help with this. If you are using the spirometer at home, follow these instructions: SEEK MEDICAL CARE IF:  You are having difficultly using the spirometer. You have trouble using the spirometer as often as instructed. Your pain medication is not giving enough relief while using the spirometer. You develop fever of 100.5 F (38.1 C) or higher. SEEK IMMEDIATE MEDICAL CARE IF:  You cough up bloody sputum that had not been present before. You develop fever of 102 F (38.9 C) or greater. You develop worsening pain at or near the incision site. MAKE SURE YOU:  Understand these instructions. Will watch your condition. Will get help right away if you are not doing well or get worse. Document Released: 07/05/2006 Document Revised: 05/17/2011 Document Reviewed: 09/05/2006 ExitCare Patient Information 2014 [place].   ________________________________________________________________________  [facility]- Preparing for Total Shoulder Arthroplasty    Before surgery, you can play an important role. Because skin is not sterile, your skin needs to be as free of germs as possible. You can reduce the number of germs on your skin by using the following  products. Benzoyl Peroxide Gel Reduces the number of germs present on the skin Applied twice a day to shoulder area starting two days before surgery    ==================================================================  Please follow these instructions carefully:  BENZOYL PEROXIDE 5% GEL  Please do not use if you have an allergy to benzoyl peroxide.   If your skin becomes reddened/irritated stop using the benzoyl peroxide.  Starting two days before surgery, apply as follows: Apply benzoyl peroxide in the morning and at night. Apply after taking a shower. If you are not taking a shower clean entire shoulder front, back, and side along with the armpit with a clean wet washcloth.  Place a quarter-sized dollop on your shoulder and rub in thoroughly, making sure to cover the front, back, and side of your shoulder, along with the armpit.   2 days before ____ AM   ____ PM              1 day before ____ AM   ____ PM                         Do this twice a day for two days.  (Last application is the night before surgery, AFTER using the CHG soap as described below).  Do NOT apply benzoyl peroxide gel on the day of surgery.

## 2023-01-24 NOTE — Progress Notes (Signed)
Left voicemail with Dr. Dub Mikes office regarding misspelling of arthritis in consent.

## 2023-01-24 NOTE — Progress Notes (Addendum)
COVID Vaccine Completed: yes  Date of COVID positive in last 90 days: no  PCP - Rodrigo Ran, MD Cardiologist - Dr. Excell Seltzer LOV 2015  Chest x-ray - n/a EKG - 01/24/23 Epic/chart Stress Test - n/a ECHO - n/a Cardiac Cath - 2005 Pacemaker/ICD device last checked: n/a Spinal Cord Stimulator: n/a  Bowel Prep - no  Sleep Study - n.a CPAP -   Fasting Blood Sugar - n/a Checks Blood Sugar _____ times a day  Last dose of GLP1 agonist-  N/A GLP1 instructions:  Hold 7 days before surgery    Last dose of SGLT-2 inhibitors-  N/A SGLT-2 instructions:  Hold 3 days before surgery    Blood Thinner Instructions:  n/a Aspirin Instructions: Last Dose:  Activity level: Can go up a flight of stairs and perform activities of daily living without stopping and without symptoms of chest pain or shortness of breath.   Anesthesia review: HTN, MI, atherosclerosis of aorta, K+ 2.7  Patient denies shortness of breath, fever, cough and chest pain at PAT appointment  Patient verbalized understanding of instructions that were given to them at the PAT appointment. Patient was also instructed that they will need to review over the PAT instructions again at home before surgery.

## 2023-01-24 NOTE — Progress Notes (Signed)
K+ 2.7, results routed to Dr. Rennis Chris

## 2023-01-25 ENCOUNTER — Other Ambulatory Visit (HOSPITAL_COMMUNITY): Payer: Self-pay

## 2023-01-25 ENCOUNTER — Encounter (HOSPITAL_COMMUNITY): Payer: Self-pay

## 2023-01-25 MED ORDER — POTASSIUM CHLORIDE ER 10 MEQ PO TBCR
10.0000 meq | EXTENDED_RELEASE_TABLET | Freq: Every day | ORAL | 3 refills | Status: DC
  Filled 2023-01-25: qty 90, 90d supply, fill #0
  Filled 2023-04-20: qty 90, 90d supply, fill #1
  Filled 2023-07-31: qty 90, 90d supply, fill #2
  Filled 2023-10-27: qty 90, 90d supply, fill #3

## 2023-01-25 NOTE — Progress Notes (Signed)
Anesthesia Chart Review   Case: [id number] Date/Time: 01/27/23 1208   Procedure: Right shoulder arthroscopy, debridement, subacromial decompression, distal clavicle resection, rotator cuff repair (Right) -   Anesthesia type: General   Pre-op diagnosis: Right shoulder impingement, acromioclavicular osteoarthritis, partial rotator cuff tear   Location: [facility] ROOM 06 / [facility]   Surgeons: Francena Hanly, MD       DISCUSSION:57 y.o. never smoker with h/o HTN, right shoulder OA scheduled for above procedure 01/27/2023 with Dr. Francena Hanly.   Pt evaluated by cardiology many years ago for hyperlipidemia.  Per OV note in 2015, "He has a history of chest pain and had a normal cardiac catheterization several years ago "  Potassium 2.7 at PAT visit. Pt reports PCP advised to hold hydrochlorothiazide and patient will eat potassium rich foods. Will check morning of surgery.  VS: BP (!) 138/99   Pulse 77   Temp 37.1 C (Oral)   Resp 16   Ht 5\' 11"  (1.803 m)   Wt 85.3 kg   SpO2 97%   BMI 26.22 kg/m   PROVIDERS: Rodrigo Ran, MD is PCP    LABS: Labs reviewed: Acceptable for surgery. and recheck DOS (all labs ordered are listed, but only abnormal results are displayed)  Labs Reviewed  BASIC METABOLIC PANEL - Abnormal; Notable for the following components:      Result Value   Potassium 2.7 (*)    Chloride 97 (*)    All other components within normal limits  CBC - Abnormal; Notable for the following components:   RBC 4.13 (*)    Hemoglobin 12.4 (*)    HCT 35.4 (*)    All other components within normal limits     IMAGES:   EKG:   CV:  Past Medical History:  Diagnosis Date   Adenomatous colon polyp    Anal fissure    Arthritis    Chest pain    Depression    GERD (gastroesophageal reflux disease)    Helicobacter pylori (H. pylori)    Hemorrhoids    Hyperlipidemia    Hypertension    Myocardial infarction (HCC) 1999   hypertensive   Peptic ulcer, unspecified site,  unspecified as acute or chronic, without mention of hemorrhage, perforation, or obstruction    Pneumonia    Posttraumatic stress disorder \   Renal calculus    hx of   Rhabdomyolysis     Past Surgical History:  Procedure Laterality Date   COLONOSCOPY  2015   COLONOSCOPY  11/2016   Jacobs-polyps   HEMORRHOID SURGERY     NASAL SINUS SURGERY     SHOULDER SURGERY Left    thermal cap   SPHINCTEROTOMY      MEDICATIONS:  Alirocumab (PRALUENT) 150 MG/ML SOAJ   buPROPion (WELLBUTRIN SR) 150 MG 12 hr tablet   desvenlafaxine (PRISTIQ) 100 MG 24 hr tablet   ezetimibe (ZETIA) 10 MG tablet   hydrochlorothiazide (HYDRODIURIL) 25 MG tablet   labetalol (NORMODYNE) 300 MG tablet   loratadine (CLARITIN) 10 MG tablet   ondansetron (ZOFRAN-ODT) 8 MG disintegrating tablet   oxyCODONE-acetaminophen (PERCOCET/ROXICET) 5-325 MG tablet   oxyCODONE-acetaminophen (PERCOCET/ROXICET) 5-325 MG tablet   pantoprazole (PROTONIX) 40 MG tablet   PRALUENT 150 MG/ML SOPN   telmisartan (MICARDIS) 20 MG tablet   telmisartan (MICARDIS) 20 MG tablet   tiZANidine (ZANAFLEX) 4 MG tablet   tiZANidine (ZANAFLEX) 4 MG tablet   triamcinolone (NASACORT) 55 MCG/ACT AERO nasal inhaler   zolpidem (AMBIEN) 5 MG tablet  zolpidem (AMBIEN) 5 MG tablet   zolpidem (AMBIEN) 5 MG tablet   zolpidem (AMBIEN) 5 MG tablet   zolpidem (AMBIEN) 5 MG tablet   Zoster Vaccine Adjuvanted [facility]) injection   No current facility-administered medications for this encounter.     Jodell Cipro Ward, PA-C [facility]-Surgical [facility] [phone]

## 2023-01-27 ENCOUNTER — Ambulatory Visit (HOSPITAL_COMMUNITY): Payer: Self-pay | Admitting: Medical

## 2023-01-27 ENCOUNTER — Other Ambulatory Visit: Payer: Self-pay

## 2023-01-27 ENCOUNTER — Ambulatory Visit (HOSPITAL_BASED_OUTPATIENT_CLINIC_OR_DEPARTMENT_OTHER): Payer: Commercial Managed Care - PPO | Admitting: Medical

## 2023-01-27 ENCOUNTER — Encounter (HOSPITAL_COMMUNITY): Payer: Self-pay | Admitting: Orthopedic Surgery

## 2023-01-27 ENCOUNTER — Encounter (HOSPITAL_COMMUNITY)
Admission: RE | Disposition: A | Discharge status: Home / Self Care | Payer: Self-pay | Source: Home / Self Care | Attending: Orthopedic Surgery

## 2023-01-27 ENCOUNTER — Ambulatory Visit (HOSPITAL_COMMUNITY)
Admission: RE | Admit: 2023-01-27 | Discharge: 2023-01-27 | Disposition: A | Discharge status: Home / Self Care | Payer: Commercial Managed Care - PPO | Attending: Orthopedic Surgery | Admitting: Orthopedic Surgery

## 2023-01-27 ENCOUNTER — Other Ambulatory Visit (HOSPITAL_COMMUNITY): Payer: Self-pay

## 2023-01-27 DIAGNOSIS — F413 Other mixed anxiety disorders: Secondary | ICD-10-CM

## 2023-01-27 DIAGNOSIS — M75111 Incomplete rotator cuff tear or rupture of right shoulder, not specified as traumatic: Secondary | ICD-10-CM | POA: Insufficient documentation

## 2023-01-27 DIAGNOSIS — M7541 Impingement syndrome of right shoulder: Secondary | ICD-10-CM | POA: Insufficient documentation

## 2023-01-27 DIAGNOSIS — Z8711 Personal history of peptic ulcer disease: Secondary | ICD-10-CM | POA: Insufficient documentation

## 2023-01-27 DIAGNOSIS — I10 Essential (primary) hypertension: Secondary | ICD-10-CM | POA: Insufficient documentation

## 2023-01-27 DIAGNOSIS — K219 Gastro-esophageal reflux disease without esophagitis: Secondary | ICD-10-CM | POA: Insufficient documentation

## 2023-01-27 DIAGNOSIS — M19011 Primary osteoarthritis, right shoulder: Secondary | ICD-10-CM | POA: Insufficient documentation

## 2023-01-27 DIAGNOSIS — G8918 Other acute postprocedural pain: Secondary | ICD-10-CM | POA: Diagnosis not present

## 2023-01-27 DIAGNOSIS — F418 Other specified anxiety disorders: Secondary | ICD-10-CM | POA: Diagnosis not present

## 2023-01-27 DIAGNOSIS — I252 Old myocardial infarction: Secondary | ICD-10-CM | POA: Insufficient documentation

## 2023-01-27 DIAGNOSIS — M75101 Unspecified rotator cuff tear or rupture of right shoulder, not specified as traumatic: Secondary | ICD-10-CM | POA: Diagnosis not present

## 2023-01-27 HISTORY — PX: SHOULDER ARTHROSCOPY WITH ROTATOR CUFF REPAIR: SHX5685

## 2023-01-27 LAB — BASIC METABOLIC PANEL WITH GFR
Anion gap: 10 (ref 5–15)
BUN: 18 mg/dL (ref 6–20)
CO2: 28 mmol/L (ref 22–32)
Calcium: 9.3 mg/dL (ref 8.9–10.3)
Chloride: 98 mmol/L (ref 98–111)
Creatinine, Ser: 1.19 mg/dL (ref 0.61–1.24)
GFR, Estimated: >60 mL/min
Glucose, Bld: 97 mg/dL (ref 70–99)
Potassium: 3.3 mmol/L — ABNORMAL LOW (ref 3.5–5.1)
Sodium: 136 mmol/L (ref 135–145)

## 2023-01-27 SURGERY — ARTHROSCOPY, SHOULDER, WITH ROTATOR CUFF REPAIR
Anesthesia: General | Laterality: Right

## 2023-01-27 MED ORDER — LIDOCAINE HCL (CARDIAC) PF 100 MG/5ML IV SOSY
PREFILLED_SYRINGE | INTRAVENOUS | Status: DC | PRN
  Administered 2023-01-27: 40 mg via INTRAVENOUS

## 2023-01-27 MED ORDER — ACETAMINOPHEN 160 MG/5ML PO SOLN: 325.0000 mg | Freq: Once | ORAL | Status: DC | PRN

## 2023-01-27 MED ORDER — MIDAZOLAM HCL 2 MG/2ML IJ SOLN: 2.0000 mg | INTRAMUSCULAR | Status: DC

## 2023-01-27 MED ORDER — PROPOFOL 10 MG/ML IV BOLUS
INTRAVENOUS | Status: DC | PRN
  Administered 2023-01-27: 150 mg via INTRAVENOUS

## 2023-01-27 MED ORDER — SUGAMMADEX SODIUM 200 MG/2ML IV SOLN
INTRAVENOUS | Status: DC | PRN
  Administered 2023-01-27: 200 mg via INTRAVENOUS

## 2023-01-27 MED ORDER — MEPERIDINE HCL 50 MG/ML IJ SOLN: 6.2500 mg | INTRAMUSCULAR | Status: DC | PRN

## 2023-01-27 MED ORDER — FENTANYL CITRATE (PF) 100 MCG/2ML IJ SOLN
INTRAMUSCULAR | Status: DC | PRN
  Administered 2023-01-27: 50 ug via INTRAVENOUS

## 2023-01-27 MED ORDER — CYCLOBENZAPRINE HCL 10 MG PO TABS
10.0000 mg | ORAL_TABLET | Freq: Three times a day (TID) | ORAL | 1 refills | Status: DC | PRN
  Filled 2023-01-27: qty 30, 10d supply, fill #0
  Filled 2023-02-11: qty 30, 10d supply, fill #1

## 2023-01-27 MED ORDER — FENTANYL CITRATE (PF) 100 MCG/2ML IJ SOLN
INTRAMUSCULAR | Status: AC
  Filled 2023-01-27: qty 2

## 2023-01-27 MED ORDER — ONDANSETRON HCL 4 MG/2ML IJ SOLN
INTRAMUSCULAR | Status: DC | PRN
  Administered 2023-01-27: 4 mg via INTRAVENOUS

## 2023-01-27 MED ORDER — DROPERIDOL 2.5 MG/ML IJ SOLN
INTRAMUSCULAR | Status: AC
  Filled 2023-01-27: qty 2

## 2023-01-27 MED ORDER — 0.9 % SODIUM CHLORIDE (POUR BTL) OPTIME
TOPICAL | Status: DC | PRN
  Administered 2023-01-27: 1000 mL

## 2023-01-27 MED ORDER — DROPERIDOL 2.5 MG/ML IJ SOLN
0.6250 mg | Freq: Once | INTRAMUSCULAR | Status: AC | PRN
  Administered 2023-01-27: 0.625 mg via INTRAVENOUS

## 2023-01-27 MED ORDER — HYDROMORPHONE HCL 1 MG/ML IJ SOLN: 0.2500 mg | INTRAMUSCULAR | Status: DC | PRN

## 2023-01-27 MED ORDER — EPHEDRINE SULFATE (PRESSORS) 50 MG/ML IJ SOLN
INTRAMUSCULAR | Status: DC | PRN
  Administered 2023-01-27 (×2): 10 mg via INTRAVENOUS

## 2023-01-27 MED ORDER — BUPIVACAINE LIPOSOME 1.3 % IJ SUSP
INTRAMUSCULAR | Status: DC | PRN
  Administered 2023-01-27: 10 mL via PERINEURAL

## 2023-01-27 MED ORDER — DEXAMETHASONE SODIUM PHOSPHATE 10 MG/ML IJ SOLN
INTRAMUSCULAR | Status: DC | PRN
  Administered 2023-01-27: 10 mg via INTRAVENOUS

## 2023-01-27 MED ORDER — FENTANYL CITRATE PF 50 MCG/ML IJ SOSY
PREFILLED_SYRINGE | INTRAMUSCULAR | Status: AC
  Administered 2023-01-27: 50 ug
  Filled 2023-01-27: qty 2

## 2023-01-27 MED ORDER — PHENYLEPHRINE HCL-NACL 20-0.9 MG/250ML-% IV SOLN
INTRAVENOUS | Status: DC | PRN
  Administered 2023-01-27: 30 ug/min via INTRAVENOUS

## 2023-01-27 MED ORDER — ORAL CARE MOUTH RINSE: 15.0000 mL | Freq: Once | OROMUCOSAL | Status: AC

## 2023-01-27 MED ORDER — LACTATED RINGERS IV SOLN: INTRAVENOUS | Status: AC

## 2023-01-27 MED ORDER — CHLORHEXIDINE GLUCONATE 0.12 % MT SOLN
15.0000 mL | Freq: Once | OROMUCOSAL | Status: AC
  Administered 2023-01-27: 15 mL via OROMUCOSAL

## 2023-01-27 MED ORDER — HYDROMORPHONE HCL 1 MG/ML IJ SOLN
INTRAMUSCULAR | Status: AC
  Administered 2023-01-27: 0.5 mg via INTRAVENOUS
  Filled 2023-01-27: qty 1

## 2023-01-27 MED ORDER — EPHEDRINE 5 MG/ML INJ
INTRAVENOUS | Status: AC
Start: 2023-01-27 — End: ?
  Filled 2023-01-27: qty 5

## 2023-01-27 MED ORDER — TRANEXAMIC ACID-NACL 1000-0.7 MG/100ML-% IV SOLN
INTRAVENOUS | Status: AC
  Filled 2023-01-27: qty 100

## 2023-01-27 MED ORDER — CEFAZOLIN SODIUM-DEXTROSE 2-4 GM/100ML-% IV SOLN
2.0000 g | INTRAVENOUS | Status: AC
  Administered 2023-01-27: 2 g via INTRAVENOUS
  Filled 2023-01-27: qty 100

## 2023-01-27 MED ORDER — ACETAMINOPHEN 10 MG/ML IV SOLN
INTRAVENOUS | Status: AC
  Filled 2023-01-27: qty 100

## 2023-01-27 MED ORDER — PHENYLEPHRINE HCL-NACL 20-0.9 MG/250ML-% IV SOLN
INTRAVENOUS | Status: AC
  Filled 2023-01-27: qty 250

## 2023-01-27 MED ORDER — LACTATED RINGERS IV SOLN: INTRAVENOUS | Status: DC

## 2023-01-27 MED ORDER — FENTANYL CITRATE PF 50 MCG/ML IJ SOSY: 50.0000 ug | PREFILLED_SYRINGE | Freq: Once | INTRAMUSCULAR | Status: DC

## 2023-01-27 MED ORDER — EPHEDRINE 5 MG/ML INJ
INTRAVENOUS | Status: AC
  Filled 2023-01-27: qty 5

## 2023-01-27 MED ORDER — MIDAZOLAM HCL 2 MG/2ML IJ SOLN
INTRAMUSCULAR | Status: AC
  Administered 2023-01-27: 2 mg
  Filled 2023-01-27: qty 2

## 2023-01-27 MED ORDER — ACETAMINOPHEN 325 MG PO TABS: 325.0000 mg | ORAL_TABLET | Freq: Once | ORAL | Status: DC | PRN

## 2023-01-27 MED ORDER — ROCURONIUM BROMIDE 100 MG/10ML IV SOLN
INTRAVENOUS | Status: DC | PRN
  Administered 2023-01-27: 50 mg via INTRAVENOUS

## 2023-01-27 MED ORDER — LIDOCAINE HCL (PF) 2 % IJ SOLN
INTRAMUSCULAR | Status: AC
  Filled 2023-01-27: qty 10

## 2023-01-27 MED ORDER — ROCURONIUM BROMIDE 10 MG/ML (PF) SYRINGE
PREFILLED_SYRINGE | INTRAVENOUS | Status: AC
  Filled 2023-01-27: qty 30

## 2023-01-27 MED ORDER — PROPOFOL 10 MG/ML IV BOLUS
INTRAVENOUS | Status: AC
  Filled 2023-01-27: qty 20

## 2023-01-27 MED ORDER — ONDANSETRON HCL 4 MG PO TABS
4.0000 mg | ORAL_TABLET | Freq: Three times a day (TID) | ORAL | 0 refills | Status: AC | PRN
  Filled 2023-01-27: qty 10, 4d supply, fill #0

## 2023-01-27 MED ORDER — HYDROMORPHONE HCL 2 MG PO TABS
4.0000 mg | ORAL_TABLET | ORAL | 0 refills | Status: DC | PRN
  Filled 2023-01-27: qty 40, 4d supply, fill #0

## 2023-01-27 MED ORDER — BUPIVACAINE HCL (PF) 0.5 % IJ SOLN
INTRAMUSCULAR | Status: DC | PRN
  Administered 2023-01-27: 12 mL via PERINEURAL

## 2023-01-27 MED ORDER — ACETAMINOPHEN 10 MG/ML IV SOLN
1000.0000 mg | Freq: Once | INTRAVENOUS | Status: DC | PRN
  Administered 2023-01-27: 1000 mg via INTRAVENOUS

## 2023-01-27 MED ORDER — ONDANSETRON HCL 4 MG/2ML IJ SOLN
INTRAMUSCULAR | Status: AC
  Filled 2023-01-27: qty 2

## 2023-01-27 SURGICAL SUPPLY — 54 items
ANCHOR PEEK 4.75X19.1 SWLK C (Anchor) IMPLANT
BAG COUNTER SPONGE SURGICOUNT (BAG) ×1 IMPLANT
BLADE EXCALIBUR 4.0X13 (MISCELLANEOUS) ×1 IMPLANT
BOOTIES KNEE HIGH SLOAN (MISCELLANEOUS) ×2 IMPLANT
BURR OVAL 8 FLU 4.0X13 (MISCELLANEOUS) ×1 IMPLANT
CANNULA ACUFLEX KIT 5X76 (CANNULA) ×1 IMPLANT
CANNULA DRILOCK 5.0X75 (CANNULA) IMPLANT
CANNULA TWIST IN 8.25X7CM (CANNULA) IMPLANT
CONNECTOR 5 IN 1 STRAIGHT STRL (MISCELLANEOUS) ×1 IMPLANT
COOLER ICEMAN CLASSIC (MISCELLANEOUS) IMPLANT
DISSECTOR 3.8MM X 13CM (MISCELLANEOUS) ×1 IMPLANT
DISSECTOR 4.0MM X 13CM (MISCELLANEOUS) IMPLANT
DRAPE INCISE 23X17 STRL (DRAPES) ×1 IMPLANT
DRAPE INCISE IOBAN 23X17 STRL (DRAPES) ×1
DRAPE INCISE IOBAN 66X45 STRL (DRAPES) ×1 IMPLANT
DRAPE STERI 35X30 U-POUCH (DRAPES) ×1 IMPLANT
DRAPE SURG 17X11 SM STRL (DRAPES) ×1 IMPLANT
DRAPE SURG ORHT 6 SPLT 77X108 (DRAPES) IMPLANT
DRAPE U-SHAPE 47X51 STRL (DRAPES) ×1 IMPLANT
DURAPREP 26ML APPLICATOR (WOUND CARE) ×1 IMPLANT
GAUZE PAD ABD 8X10 STRL (GAUZE/BANDAGES/DRESSINGS) ×2 IMPLANT
GAUZE SPONGE 4X4 12PLY STRL (GAUZE/BANDAGES/DRESSINGS) ×1 IMPLANT
GLOVE BIO SURGEON STRL SZ7.5 (GLOVE) ×1 IMPLANT
GLOVE BIO SURGEON STRL SZ8 (GLOVE) ×1 IMPLANT
GLOVE SS BIOGEL STRL SZ 7 (GLOVE) ×1 IMPLANT
GLOVE SS BIOGEL STRL SZ 7.5 (GLOVE) ×1 IMPLANT
GOWN STRL SURGICAL XL XLNG (GOWN DISPOSABLE) ×2 IMPLANT
KIT BASIN OR (CUSTOM PROCEDURE TRAY) ×1 IMPLANT
KIT SHOULDER TRACTION (DRAPES) ×1 IMPLANT
KIT TURNOVER KIT A (KITS) IMPLANT
MANIFOLD NEPTUNE II (INSTRUMENTS) ×1 IMPLANT
NDL HD SCORPION MEGA LOADER (NEEDLE) IMPLANT
NS IRRIG 1000ML POUR BTL (IV SOLUTION) ×1 IMPLANT
PACK ARTHROSCOPY WL (CUSTOM PROCEDURE TRAY) ×1 IMPLANT
PAD ARMBOARD 7.5X6 YLW CONV (MISCELLANEOUS) ×1 IMPLANT
PAD COLD SHLDR WRAP-ON (PAD) IMPLANT
SLING ARM FOAM STRAP MED (SOFTGOODS) IMPLANT
SLING ULTRA II L (ORTHOPEDIC SUPPLIES) IMPLANT
SPONGE T-LAP 4X18 ~~LOC~~+RFID (SPONGE) ×1 IMPLANT
STRIP CLOSURE SKIN 1/2X4 (GAUZE/BANDAGES/DRESSINGS) ×1 IMPLANT
SUT FIBERWIRE #2 38 T-5 BLUE (SUTURE)
SUT MNCRL AB 3-0 PS2 18 (SUTURE) ×1 IMPLANT
SUT PDS AB 0 CT 36 (SUTURE) IMPLANT
SUT TIGER TAPE 7 IN WHITE (SUTURE) ×1 IMPLANT
SUTURE FIBERWR #2 38 T-5 BLUE (SUTURE) IMPLANT
SYR BULB IRRIG 60ML STRL (SYRINGE) IMPLANT
TAPE FIBER 2MM 7IN #2 BLUE (SUTURE) IMPLANT
TAPE PAPER 1X10 WHT MICROPORE (GAUZE/BANDAGES/DRESSINGS) IMPLANT
TAPE PAPER 3X10 WHT MICROPORE (GAUZE/BANDAGES/DRESSINGS) ×1 IMPLANT
TOWEL OR 17X26 10 PK STRL BLUE (TOWEL DISPOSABLE) ×1 IMPLANT
TUBING ARTHROSCOPY IRRIG 16FT (MISCELLANEOUS) ×1 IMPLANT
WAND ABLATOR APOLLO I90 (BUR) ×1 IMPLANT
WATER STERILE IRR 1000ML POUR (IV SOLUTION) ×1 IMPLANT
YANKAUER SUCT BULB TIP 10FT TU (MISCELLANEOUS) ×1 IMPLANT

## 2023-01-27 NOTE — H&P (Signed)
[name]    Chief Complaint: Right shoulder impingement, acromioclavicular osteoarthritis, partial rotator cuff tear HPI: The patient is a 57 y.o. male with chronic and progressively increasing right shoulder pain related to impingement syndrome.  Recent MRI scan confirms AC joint arthritis as well as bony impingement with a partial rotator cuff tear.  Patient brought to the operating room at this time for planned right shoulder arthroscopy.  Past Medical History:  Diagnosis Date   Adenomatous colon polyp    Anal fissure    Arthritis    Chest pain    Depression    GERD (gastroesophageal reflux disease)    Helicobacter pylori (H. pylori)    Hemorrhoids    Hyperlipidemia    Hypertension    Myocardial infarction (HCC) 1999   hypertensive   Peptic ulcer, unspecified site, unspecified as acute or chronic, without mention of hemorrhage, perforation, or obstruction    Pneumonia    Posttraumatic stress disorder \   Renal calculus    hx of   Rhabdomyolysis       Past Surgical History:  Procedure Laterality Date   COLONOSCOPY  2015   COLONOSCOPY  11/2016   Jacobs-polyps   HEMORRHOID SURGERY     NASAL SINUS SURGERY     SHOULDER SURGERY Left    thermal cap   SPHINCTEROTOMY      Family History  Problem Relation Age of Onset   Alzheimer's disease Mother 39       died   Hyperlipidemia Father 8       alive -and back probldems   Colon polyps Father    Thyroid cancer Father    Aplastic anemia Father    Hematuria Brother 63   Diabetes Paternal Grandmother    Colon polyps Paternal Grandmother    Diabetes Maternal Grandmother    Colon polyps Maternal Grandmother    Colon cancer Neg Hx    Esophageal cancer Neg Hx    Rectal cancer Neg Hx    Stomach cancer Neg Hx     Social History:  reports that he has never smoked. His smokeless tobacco use includes chew. He reports that he does not currently use alcohol. He reports that he does not use drugs.  BMI: Estimated body  mass index is 26.22 kg/m as calculated from the following:   Height as of 01/24/23: 5\' 11"  (1.803 m).   Weight as of 01/24/23: 85.3 kg.  Lab Results  Component Value Date   ALBUMIN 4.3 07/07/2012   Diabetes: Patient does not have a diagnosis of diabetes.     Smoking Status:   reports that he has never smoked. His smokeless tobacco use includes chew.     No medications prior to admission.     Physical Exam: Right shoulder demonstrates functional range of motion with overall good strength to manual muscle testing.  He has pain with overhead elevation when a markedly positive impingement sign.  Locally tender palpation over the [facility] joint with positive crossover.  Negative drop arm.  Recent MRI scan confirms AC joint arthritis with bony impingement and partial rotator cuff tear.  Vitals     Assessment/Plan  Impression: Right shoulder impingement, acromioclavicular osteoarthritis, partial rotator cuff tear  Plan of Action: Procedure(s): Right shoulder arthroscopy, debridement, subacromial decompression, distal clavicle resection, rotator cuff repair  Vernelle Wisner M Beatris Belen 01/27/2023, 5:50 AM Contact # [phone]

## 2023-01-27 NOTE — Discharge Instructions (Signed)
   Kevin M. Supple, M.D., F.A.A.O.S. [facility] Specializing in Arthroscopic and Reconstructive Surgery of the Shoulder [phone] [address] - [place], [place] [address] - Fax [phone]  POST-OP SHOULDER ARTHROSCOPIC ROTATOR CUFF  REPAIR INSTRUCTIONS  1. Call the office at [phone] to schedule your first post-op appointment 7-10 days from the date of your surgery.  2. Leave the steri-strips in place over your incisions when performing dressing changes and showering. You may remove your dressings and begin showering 72 hours from surgery. You can expect drainage that is clear to bloody in nature that occasionally will soak through your dressings. If this occurs go ahead and perform a dressing change. The drainage should lessen daily and when there is no drainage from your incisions feel free to go without a dressing.  3. Wear your sling/immobilizer at all times except to perform the exercises below or to occasionally let your arm dangle by your side to stretch your elbow. You also need to sleep in your sling immobilizer until instructed otherwise.  4. Range of motion to your elbow, wrist, and hand are encouraged 3-5 times daily. Exercise to your hand and fingers helps to reduce swelling you may experience.  5. Utilize ice to the shoulder 3-4 times minimum a day and additionally if you are experiencing pain.  6. You may one-armed drive when safely off of narcotics and muscle relaxants. You may use your hand that is in the sling to support the steering wheel only. However, should it be your right arm that is in the sling it is not to be used for gear shifting in a manual transmission.  7. Pain control following an exparel block  To help control your post-operative pain you received a nerve block  performed with Exparel which is a long acting anesthetic (numbing agent) which can provide pain relief and sensations of numbness (and relief of pain) in the operative  shoulder and arm for up to 3 days. Sometimes it provides mixed relief, meaning you may still have numbness in certain areas of the arm but can still be able to move  parts of that arm, hand, and fingers. We recommend that your prescribed pain medications  be used as needed. We do not feel it is necessary to "pre medicate" and "stay ahead" of pain.  Taking narcotic pain medications when you are not having any pain can lead to unnecessary and potentially dangerous side effects.    8. Pain medications can produce constipation along with their use. If you experience this, the use of an over the counter stool softener or laxative daily is recommended.   9. For additional questions or concerns, please do not hesitate to call the office. If after hours there is an answering service to forward your concerns to the physician on call.  POST-OP EXERCISES  Pendulum Exercises  Perform pendulum exercises while standing and bending at the waist. Support your uninvolved arm on a table or chair and allow your operated arm to hang freely. Make sure to do these exercises passively - not using you shoulder muscle.  Repeat 20 times. Do 3 sessions per day.   

## 2023-01-27 NOTE — Anesthesia Preprocedure Evaluation (Addendum)
Anesthesia Evaluation  Patient identified by MRN, date of birth, ID band Patient awake    Reviewed: Allergy & Precautions, NPO status , Patient's Chart, lab work & pertinent test results  Airway Mallampati: II  TM Distance: <3 FB Neck ROM: Full    Dental  (+) Missing,    Pulmonary    breath sounds clear to auscultation       Cardiovascular hypertension, Pt. on home beta blockers and Pt. on medications + Past MI   Rhythm:Regular Rate:Normal     Neuro/Psych  PSYCHIATRIC DISORDERS Anxiety Depression     Neuromuscular disease    GI/Hepatic Neg liver ROS, PUD,GERD  Medicated,,  Endo/Other  negative endocrine ROS    Renal/GU Renal disease     Musculoskeletal   Abdominal   Peds  Hematology negative hematology ROS (+)   Anesthesia Other Findings   Reproductive/Obstetrics                             Anesthesia Physical Anesthesia Plan  ASA: 3  Anesthesia Plan: General   Post-op Pain Management: Tylenol PO (pre-op)* and Toradol IV (intra-op)*   Induction: Intravenous  PONV Risk Score and Plan: 3 and Ondansetron, Dexamethasone and Midazolam  Airway Management Planned: Oral ETT  Additional Equipment: None  Intra-op Plan:   Post-operative Plan: Extubation in OR  Informed Consent: I have reviewed the patients History and Physical, chart, labs and discussed the procedure including the risks, benefits and alternatives for the proposed anesthesia with the patient or authorized representative who has indicated his/her understanding and acceptance.     Dental advisory given  Plan Discussed with: CRNA  Anesthesia Plan Comments:        Anesthesia Quick Evaluation

## 2023-01-27 NOTE — Anesthesia Procedure Notes (Signed)
Anesthesia Regional Block: Interscalene brachial plexus block   Pre-Anesthetic Checklist: , timeout performed,  Correct Patient, Correct Site, Correct Laterality,  Correct Procedure, Correct Position, site marked,  Risks and benefits discussed,  Surgical consent,  Pre-op evaluation,  At surgeon's request and post-op pain management  Laterality: Right  Prep: chloraprep       Needles:  Injection technique: Single-shot  Needle Type: Echogenic Stimulator Needle     Needle Length: 9cm  Needle Gauge: 21     Additional Needles:   Procedures:,,,, ultrasound used (permanent image in chart),,    Narrative:  Start time: 01/27/2023 11:45 AM End time: 01/27/2023 11:50 AM Injection made incrementally with aspirations every 5 mL.  Performed by: Personally  Anesthesiologist: Shelton Silvas, MD  Additional Notes: Discussed risks and benefits of the nerve block in detail, including but not limited vascular injury, permanent nerve damage and infection.   Patient tolerated the procedure well. Local anesthetic introduced in an incremental fashion under minimal resistance after negative aspirations. No paresthesias were elicited. After completion of the procedure, no acute issues were identified and patient continued to be monitored by RN.

## 2023-01-27 NOTE — Transfer of Care (Signed)
Immediate Anesthesia Transfer of Care Note  Patient: [name]  Procedure(s) Performed: Right shoulder arthroscopy, debridement, subacromial decompression, distal clavicle resection, rotator cuff repair (Right)  Patient Location: PACU  Anesthesia Type:General and Regional  Level of Consciousness: awake, alert , and oriented  Airway & Oxygen Therapy: Patient Spontanous Breathing and Patient connected to face mask oxygen  Post-op Assessment: Report given to RN and Post -op Vital signs reviewed and stable  Post vital signs: Reviewed and stable  Last Vitals:  Vitals Value Taken Time  BP 135/84 01/27/23 1358  Temp    Pulse 77 01/27/23 1359  Resp 18 01/27/23 1359  SpO2 94 % 01/27/23 1359  Vitals shown include unfiled device data.  Last Pain:  Vitals:   01/27/23 1043  TempSrc:   PainSc: 2       Patients Stated Pain Goal: 5 (01/27/23 1043)  Complications: No notable events documented.

## 2023-01-27 NOTE — Op Note (Signed)
01/27/2023  1:26 PM  PATIENT:   [name]  57 y.o. male  PRE-OPERATIVE DIAGNOSIS:  Right shoulder impingement, acromioclavicular osteoarthritis, partial rotator cuff tear  POST-OPERATIVE DIAGNOSIS: Same with additional finding of degenerative labral tear  PROCEDURE:  1.  Right shoulder examination under anesthesia  2.  Right shoulder glenohumeral joint diagnostic arthroscopy  3.  Debridement of degenerative labral tear  4.  Arthroscopic subacromial decompression and bursectomy  5.  Arthroscopic distal clavicle resection  6.  Arthroscopic rotator cuff repair using a double row suture bridge repair construct.  SURGEON:  Senaida Lange M.D.  ASSISTANTS: Ralene Bathe, PA-C  Ralene Bathe, PA-C was utilized as an Geophysicist/field seismologist throughout this case, essential for help with positioning the patient, positioning extremity, tissue manipulation, implantation of the prosthesis, suture management, wound closure, and intraoperative decision-making.    ANESTHESIA:   General Endotracheal and interscalene block with Exparel  EBL: Minimal  SPECIMEN: None  Drains: None   PATIENT DISPOSITION:  PACU - hemodynamically stable.    PLAN OF CARE: Discharge to home after PACU  Brief history:  Patient is a 57 year old male followed for chronic and progressively increasing right shoulder pain related to impingement syndrome and recent MRI scan demonstrating a high-grade partial not occult full-thickness rotator cuff tear.  Due to his increasing pain and function limitations and failure to respond to prolonged attempts of conservative management, he is brought to the operating room this time for planned right shoulder arthroscopy as described below.  Preoperatively, I counseled the patient regarding treatment options and risks versus benefits thereof.  Possible surgical complications were all reviewed including potential for bleeding, infection, neurovascular injury, persistent pain, loss  of motion, anesthetic complication, failure of the implant, recurrence of rotator cuff tear, and possible need for additional surgery. They understand and accept and agrees with our planned procedure.  Procedure detail:  After undergoing routine preop evaluation the patient received prophylactic antibiotics and interscalene block with Exparel established in the holding area by the anesthesia department.  Subsequently placed spine on the operating table and underwent the smooth induction of a general endotracheal anesthesia.  Placed into the left lateral decubitus position on a beanbag and appropriately padded protected.  Right shoulder examination under anesthesia revealed full motion with no instability patterns noted.  The right arm was then suspended at 70 degrees of abduction with 15 pounds of traction and the right shoulder girdle region was sterilely prepped and draped in standard fashion.  Timeout was called.  Posterior portal established into the glenohumeral joint and anterior portal established under direct visualization.  Diagnostic arthroscopy was performed.  The articular surfaces were in pristine condition.  There was degenerative tearing of anterior as well as superior labrum which was debrided with a shaver to a stable margin.  The biceps tendon was of normal caliber with no evidence for proximal or distal instability.  The rotator cuff was carefully inspected and found to be intact without obvious fraying or degeneration wound visualized from the articular side.  Capsular volume was within normal limits with minimal synovitis.  This point fluid and incidents were then removed.  The arm was then dropped down to 30 degrees of abduction with the arthroscope introduced in the subacromial space of the posterior portal and a direct lateral portal established in the subacromial space.  Abundant dense bursal tissue multiple adhesions were encountered and these were all divided and excised with a  combination of the shaver and the Stryker wand.  The wand was  then used to remove the periosteum from the undersurface of the anterior half of the acromion and a subacromial decompression was performed with a bur crating type I morphology.  Portals then established directly anterior to the distal clavicle and a distal clavicle resection was performed with a bur.  Care was taken to confirm visualization of the entire circumference of the distal clavicle to ensure adequate mobile bone.  We then completed the subacromial subdeltoid bursectomy.  Probing of the bursal surface of the rotator cuff showed an obvious high-grade partial rotator cuff tear with just a very thin layer of articular sided fibers intact.  This area was debrided with a shaver and ultimately had a footprint approximate 2 cm in width.  The Arthrex wand was used to remove the residual periosteum on the greater tuberosity and the bone was then gently burred to a bleeding bed.  The margin of the rotator cuff was then turned back to healthy tissue using a shaver.  Through a stab wound off the lateral margin of the acromion we placed an Arthrex peek swivel lock suture anchor 4.75 loaded with 2 fiber tapes.  All 6 suture limbs were then passed equidistant across the width of the rotator cuff tear using the scorpion suture passer and the suture limbs were then passed in an alternating fashion into 2 lateral row anchors crating a double row repair which nicely brought the margin of the rotator cuff tendon over the footprint on the tuberosity with the overall construct much to our satisfaction.  All suture limbs were then trimmed.  Final irrigation and hemostasis was obtained.  Fluid and cyst removed.  The portals were closed with Monocryl and a Steri-Strip.  A dry dressing taped about the right shoulder Reiners placed into a sling immobilizer and the patient was awakened, extubated, and taken to the recovery in stable condition.  Senaida Lange  MD    Contact # [phone]

## 2023-01-27 NOTE — Anesthesia Procedure Notes (Signed)
Procedure Name: Intubation Date/Time: 01/27/2023 12:19 PM  Performed by: Nathen May, CRNAPre-anesthesia Checklist: Patient identified, Emergency Drugs available, Suction available and Patient being monitored Patient Re-evaluated:Patient Re-evaluated prior to induction Oxygen Delivery Method: Circle System Utilized Preoxygenation: Pre-oxygenation with 100% oxygen Induction Type: IV induction Ventilation: Mask ventilation without difficulty Laryngoscope Size: Mac and 3 Tube type: Oral Tube size: 7.5 mm Number of attempts: 1 Airway Equipment and Method: Stylet and Oral airway Placement Confirmation: ETT inserted through vocal cords under direct vision, positive ETCO2 and breath sounds checked- equal and bilateral Secured at: 23 cm Tube secured with: Tape Dental Injury: Teeth and Oropharynx as per pre-operative assessment

## 2023-01-27 NOTE — Anesthesia Postprocedure Evaluation (Signed)
Anesthesia Post Note  Patient: [name]  Procedure(s) Performed: Right shoulder arthroscopy, debridement, subacromial decompression, distal clavicle resection, rotator cuff repair (Right)     Patient location during evaluation: PACU Anesthesia Type: General Level of consciousness: awake and alert Pain management: pain level controlled Vital Signs Assessment: post-procedure vital signs reviewed and stable Respiratory status: spontaneous breathing, nonlabored ventilation, respiratory function stable and patient connected to nasal cannula oxygen Cardiovascular status: blood pressure returned to baseline and stable Postop Assessment: no apparent nausea or vomiting Anesthetic complications: no   No notable events documented.  Last Vitals:  Vitals:   01/27/23 1520 01/27/23 1530  BP:  (!) 130/90  Pulse: 73 70  Resp: 15 11  Temp:    SpO2: 91% 93%    Last Pain:  Vitals:   01/27/23 1530  TempSrc:   PainSc: 0-No pain                 Shelton Silvas

## 2023-01-28 ENCOUNTER — Encounter (HOSPITAL_COMMUNITY): Payer: Self-pay | Admitting: Orthopedic Surgery

## 2023-02-08 DIAGNOSIS — M25511 Pain in right shoulder: Secondary | ICD-10-CM | POA: Diagnosis not present

## 2023-02-11 ENCOUNTER — Other Ambulatory Visit: Payer: Self-pay

## 2023-02-11 ENCOUNTER — Other Ambulatory Visit (HOSPITAL_COMMUNITY): Payer: Self-pay

## 2023-02-11 MED ORDER — ZOLPIDEM TARTRATE 5 MG PO TABS
5.0000 mg | ORAL_TABLET | Freq: Every evening | ORAL | 0 refills | Status: DC | PRN
  Filled 2023-03-12: qty 30, 30d supply, fill #0

## 2023-02-14 ENCOUNTER — Other Ambulatory Visit (HOSPITAL_COMMUNITY): Payer: Self-pay

## 2023-02-14 ENCOUNTER — Other Ambulatory Visit: Payer: Self-pay

## 2023-02-16 DIAGNOSIS — M25511 Pain in right shoulder: Secondary | ICD-10-CM | POA: Diagnosis not present

## 2023-02-21 ENCOUNTER — Other Ambulatory Visit: Payer: Self-pay

## 2023-02-23 DIAGNOSIS — M25511 Pain in right shoulder: Secondary | ICD-10-CM | POA: Diagnosis not present

## 2023-03-01 DIAGNOSIS — M25511 Pain in right shoulder: Secondary | ICD-10-CM | POA: Diagnosis not present

## 2023-03-08 DIAGNOSIS — M25511 Pain in right shoulder: Secondary | ICD-10-CM | POA: Diagnosis not present

## 2023-03-11 ENCOUNTER — Other Ambulatory Visit (HOSPITAL_COMMUNITY): Payer: Self-pay

## 2023-03-11 DIAGNOSIS — F1120 Opioid dependence, uncomplicated: Secondary | ICD-10-CM | POA: Diagnosis not present

## 2023-03-11 DIAGNOSIS — M47816 Spondylosis without myelopathy or radiculopathy, lumbar region: Secondary | ICD-10-CM | POA: Diagnosis not present

## 2023-03-11 MED ORDER — OXYCODONE-ACETAMINOPHEN 5-325 MG PO TABS
1.0000 | ORAL_TABLET | Freq: Four times a day (QID) | ORAL | 0 refills | Status: DC | PRN
  Filled 2023-04-15: qty 39, 10d supply, fill #0
  Filled 2023-04-15: qty 51, 13d supply, fill #0
  Filled 2023-04-15: qty 90, 23d supply, fill #0

## 2023-03-11 MED ORDER — OXYCODONE-ACETAMINOPHEN 5-325 MG PO TABS
1.0000 | ORAL_TABLET | Freq: Four times a day (QID) | ORAL | 0 refills | Status: DC
  Filled 2023-03-12 – 2023-03-16 (×4): qty 90, 23d supply, fill #0

## 2023-03-11 MED ORDER — OXYCODONE-ACETAMINOPHEN 5-325 MG PO TABS
1.0000 | ORAL_TABLET | Freq: Four times a day (QID) | ORAL | 0 refills | Status: DC | PRN
  Filled 2023-05-16: qty 90, 23d supply, fill #0

## 2023-03-13 ENCOUNTER — Other Ambulatory Visit (HOSPITAL_COMMUNITY): Payer: Self-pay

## 2023-03-14 ENCOUNTER — Other Ambulatory Visit (HOSPITAL_COMMUNITY): Payer: Self-pay

## 2023-03-15 ENCOUNTER — Other Ambulatory Visit (HOSPITAL_COMMUNITY): Payer: Self-pay

## 2023-03-15 DIAGNOSIS — M25511 Pain in right shoulder: Secondary | ICD-10-CM | POA: Diagnosis not present

## 2023-03-16 ENCOUNTER — Other Ambulatory Visit (HOSPITAL_COMMUNITY): Payer: Self-pay

## 2023-03-16 ENCOUNTER — Other Ambulatory Visit: Payer: Self-pay

## 2023-03-16 DIAGNOSIS — I10 Essential (primary) hypertension: Secondary | ICD-10-CM | POA: Diagnosis not present

## 2023-03-22 DIAGNOSIS — M25511 Pain in right shoulder: Secondary | ICD-10-CM | POA: Diagnosis not present

## 2023-03-24 DIAGNOSIS — M25511 Pain in right shoulder: Secondary | ICD-10-CM | POA: Diagnosis not present

## 2023-03-31 ENCOUNTER — Other Ambulatory Visit (HOSPITAL_COMMUNITY): Payer: Self-pay

## 2023-03-31 ENCOUNTER — Other Ambulatory Visit: Payer: Self-pay

## 2023-03-31 DIAGNOSIS — M25511 Pain in right shoulder: Secondary | ICD-10-CM | POA: Diagnosis not present

## 2023-04-01 ENCOUNTER — Other Ambulatory Visit (HOSPITAL_COMMUNITY): Payer: Self-pay

## 2023-04-01 MED ORDER — BUPROPION HCL ER (SR) 150 MG PO TB12
150.0000 mg | ORAL_TABLET | Freq: Every morning | ORAL | 3 refills | Status: DC
  Filled 2023-04-01: qty 90, 90d supply, fill #0
  Filled 2023-06-29: qty 90, 90d supply, fill #1
  Filled 2023-10-03: qty 90, 90d supply, fill #2
  Filled 2024-01-03: qty 90, 90d supply, fill #3

## 2023-04-05 DIAGNOSIS — M25511 Pain in right shoulder: Secondary | ICD-10-CM | POA: Diagnosis not present

## 2023-04-08 ENCOUNTER — Other Ambulatory Visit (HOSPITAL_BASED_OUTPATIENT_CLINIC_OR_DEPARTMENT_OTHER): Payer: Self-pay

## 2023-04-08 ENCOUNTER — Other Ambulatory Visit: Payer: Self-pay

## 2023-04-08 ENCOUNTER — Other Ambulatory Visit (HOSPITAL_COMMUNITY): Payer: Self-pay

## 2023-04-08 MED ORDER — ZOLPIDEM TARTRATE 5 MG PO TABS
5.0000 mg | ORAL_TABLET | Freq: Every evening | ORAL | 0 refills | Status: DC | PRN
  Filled 2023-04-13: qty 30, 30d supply, fill #0

## 2023-04-08 MED ORDER — PANTOPRAZOLE SODIUM 40 MG PO TBEC
40.0000 mg | DELAYED_RELEASE_TABLET | Freq: Every day | ORAL | 2 refills | Status: DC
  Filled 2023-04-08: qty 90, 90d supply, fill #0
  Filled 2023-07-12: qty 90, 90d supply, fill #1
  Filled 2023-10-10: qty 90, 90d supply, fill #2

## 2023-04-08 MED ORDER — EZETIMIBE 10 MG PO TABS
10.0000 mg | ORAL_TABLET | Freq: Every day | ORAL | 2 refills | Status: DC
  Filled 2023-04-08: qty 90, 90d supply, fill #0
  Filled 2023-05-12 – 2023-07-12 (×2): qty 90, 90d supply, fill #1
  Filled 2023-10-10: qty 90, 90d supply, fill #2

## 2023-04-08 MED ORDER — TELMISARTAN 20 MG PO TABS
20.0000 mg | ORAL_TABLET | Freq: Two times a day (BID) | ORAL | 1 refills | Status: DC
  Filled 2023-04-08: qty 180, 90d supply, fill #0
  Filled 2023-07-12: qty 180, 90d supply, fill #1

## 2023-04-11 ENCOUNTER — Other Ambulatory Visit: Payer: Self-pay

## 2023-04-12 ENCOUNTER — Other Ambulatory Visit (HOSPITAL_COMMUNITY): Payer: Self-pay

## 2023-04-12 DIAGNOSIS — M25511 Pain in right shoulder: Secondary | ICD-10-CM | POA: Diagnosis not present

## 2023-04-14 ENCOUNTER — Other Ambulatory Visit: Payer: Self-pay

## 2023-04-14 ENCOUNTER — Other Ambulatory Visit (HOSPITAL_COMMUNITY): Payer: Self-pay

## 2023-04-14 DIAGNOSIS — M25511 Pain in right shoulder: Secondary | ICD-10-CM | POA: Diagnosis not present

## 2023-04-15 ENCOUNTER — Other Ambulatory Visit: Payer: Self-pay

## 2023-04-15 ENCOUNTER — Other Ambulatory Visit (HOSPITAL_COMMUNITY): Payer: Self-pay

## 2023-04-25 DIAGNOSIS — M25511 Pain in right shoulder: Secondary | ICD-10-CM | POA: Diagnosis not present

## 2023-05-02 DIAGNOSIS — M25511 Pain in right shoulder: Secondary | ICD-10-CM | POA: Diagnosis not present

## 2023-05-04 DIAGNOSIS — M25511 Pain in right shoulder: Secondary | ICD-10-CM | POA: Diagnosis not present

## 2023-05-07 ENCOUNTER — Other Ambulatory Visit (HOSPITAL_COMMUNITY): Payer: Self-pay

## 2023-05-09 ENCOUNTER — Other Ambulatory Visit (HOSPITAL_COMMUNITY): Payer: Self-pay

## 2023-05-09 ENCOUNTER — Other Ambulatory Visit: Payer: Self-pay

## 2023-05-09 DIAGNOSIS — M25511 Pain in right shoulder: Secondary | ICD-10-CM | POA: Diagnosis not present

## 2023-05-09 MED ORDER — ZOLPIDEM TARTRATE 5 MG PO TABS
10.0000 mg | ORAL_TABLET | Freq: Every day | ORAL | 0 refills | Status: DC
  Filled 2023-05-09: qty 30, 15d supply, fill #0
  Filled 2023-05-11: qty 30, 30d supply, fill #0

## 2023-05-10 ENCOUNTER — Other Ambulatory Visit (HOSPITAL_COMMUNITY): Payer: Self-pay

## 2023-05-12 ENCOUNTER — Other Ambulatory Visit (HOSPITAL_COMMUNITY): Payer: Self-pay

## 2023-05-12 ENCOUNTER — Other Ambulatory Visit: Payer: Self-pay

## 2023-05-13 ENCOUNTER — Other Ambulatory Visit (HOSPITAL_COMMUNITY): Payer: Self-pay

## 2023-05-13 ENCOUNTER — Other Ambulatory Visit: Payer: Self-pay

## 2023-05-13 MED ORDER — ZOLPIDEM TARTRATE 5 MG PO TABS
5.0000 mg | ORAL_TABLET | Freq: Every day | ORAL | 0 refills | Status: DC
  Filled 2023-05-13 – 2023-06-08 (×2): qty 90, 90d supply, fill #0

## 2023-05-16 ENCOUNTER — Other Ambulatory Visit (HOSPITAL_COMMUNITY): Payer: Self-pay

## 2023-05-17 DIAGNOSIS — M25511 Pain in right shoulder: Secondary | ICD-10-CM | POA: Diagnosis not present

## 2023-05-18 ENCOUNTER — Other Ambulatory Visit (HOSPITAL_COMMUNITY): Payer: Self-pay

## 2023-05-23 ENCOUNTER — Other Ambulatory Visit (HOSPITAL_COMMUNITY): Payer: Self-pay

## 2023-06-08 ENCOUNTER — Other Ambulatory Visit (HOSPITAL_COMMUNITY): Payer: Self-pay

## 2023-06-08 DIAGNOSIS — G5701 Lesion of sciatic nerve, right lower limb: Secondary | ICD-10-CM | POA: Diagnosis not present

## 2023-06-08 DIAGNOSIS — M47816 Spondylosis without myelopathy or radiculopathy, lumbar region: Secondary | ICD-10-CM | POA: Diagnosis not present

## 2023-06-08 DIAGNOSIS — Z6827 Body mass index (BMI) 27.0-27.9, adult: Secondary | ICD-10-CM | POA: Diagnosis not present

## 2023-06-08 DIAGNOSIS — F1120 Opioid dependence, uncomplicated: Secondary | ICD-10-CM | POA: Diagnosis not present

## 2023-06-08 MED ORDER — OXYCODONE-ACETAMINOPHEN 5-325 MG PO TABS
ORAL_TABLET | ORAL | 0 refills | Status: DC
  Filled 2023-06-14: qty 90, fill #0
  Filled 2023-06-15: qty 90, 30d supply, fill #0

## 2023-06-08 MED ORDER — OXYCODONE-ACETAMINOPHEN 5-325 MG PO TABS
ORAL_TABLET | ORAL | 0 refills | Status: DC
  Filled 2023-07-12: qty 90, fill #0
  Filled 2023-07-14: qty 90, 22d supply, fill #0

## 2023-06-08 MED ORDER — OXYCODONE-ACETAMINOPHEN 5-325 MG PO TABS
ORAL_TABLET | ORAL | 0 refills | Status: DC
  Filled 2023-08-13 (×2): qty 90, 23d supply, fill #0

## 2023-06-09 ENCOUNTER — Other Ambulatory Visit: Payer: Self-pay

## 2023-06-14 ENCOUNTER — Other Ambulatory Visit (HOSPITAL_COMMUNITY): Payer: Self-pay

## 2023-06-15 ENCOUNTER — Other Ambulatory Visit (HOSPITAL_COMMUNITY): Payer: Self-pay

## 2023-06-29 ENCOUNTER — Other Ambulatory Visit (HOSPITAL_COMMUNITY): Payer: Self-pay

## 2023-06-30 ENCOUNTER — Other Ambulatory Visit (HOSPITAL_COMMUNITY): Payer: Self-pay

## 2023-06-30 ENCOUNTER — Other Ambulatory Visit: Payer: Self-pay

## 2023-06-30 MED ORDER — DESVENLAFAXINE SUCCINATE ER 50 MG PO TB24
50.0000 mg | ORAL_TABLET | Freq: Every day | ORAL | 3 refills | Status: AC
  Filled 2023-06-30: qty 90, 90d supply, fill #0
  Filled 2023-10-03: qty 90, 90d supply, fill #1
  Filled 2023-11-25 – 2024-01-03 (×2): qty 90, 90d supply, fill #2
  Filled 2024-03-28: qty 90, 90d supply, fill #3

## 2023-07-01 DIAGNOSIS — R7301 Impaired fasting glucose: Secondary | ICD-10-CM | POA: Diagnosis not present

## 2023-07-01 DIAGNOSIS — E876 Hypokalemia: Secondary | ICD-10-CM | POA: Diagnosis not present

## 2023-07-01 DIAGNOSIS — E785 Hyperlipidemia, unspecified: Secondary | ICD-10-CM | POA: Diagnosis not present

## 2023-07-01 DIAGNOSIS — E538 Deficiency of other specified B group vitamins: Secondary | ICD-10-CM | POA: Diagnosis not present

## 2023-07-01 DIAGNOSIS — I10 Essential (primary) hypertension: Secondary | ICD-10-CM | POA: Diagnosis not present

## 2023-07-01 DIAGNOSIS — Z125 Encounter for screening for malignant neoplasm of prostate: Secondary | ICD-10-CM | POA: Diagnosis not present

## 2023-07-01 DIAGNOSIS — D649 Anemia, unspecified: Secondary | ICD-10-CM | POA: Diagnosis not present

## 2023-07-01 DIAGNOSIS — R82998 Other abnormal findings in urine: Secondary | ICD-10-CM | POA: Diagnosis not present

## 2023-07-01 DIAGNOSIS — Z1212 Encounter for screening for malignant neoplasm of rectum: Secondary | ICD-10-CM | POA: Diagnosis not present

## 2023-07-01 DIAGNOSIS — E559 Vitamin D deficiency, unspecified: Secondary | ICD-10-CM | POA: Diagnosis not present

## 2023-07-08 ENCOUNTER — Other Ambulatory Visit (HOSPITAL_COMMUNITY): Payer: Self-pay

## 2023-07-08 DIAGNOSIS — F329 Major depressive disorder, single episode, unspecified: Secondary | ICD-10-CM | POA: Diagnosis not present

## 2023-07-08 DIAGNOSIS — E785 Hyperlipidemia, unspecified: Secondary | ICD-10-CM | POA: Diagnosis not present

## 2023-07-08 DIAGNOSIS — I10 Essential (primary) hypertension: Secondary | ICD-10-CM | POA: Diagnosis not present

## 2023-07-08 DIAGNOSIS — R7301 Impaired fasting glucose: Secondary | ICD-10-CM | POA: Diagnosis not present

## 2023-07-08 DIAGNOSIS — D126 Benign neoplasm of colon, unspecified: Secondary | ICD-10-CM | POA: Diagnosis not present

## 2023-07-08 DIAGNOSIS — M1990 Unspecified osteoarthritis, unspecified site: Secondary | ICD-10-CM | POA: Diagnosis not present

## 2023-07-08 DIAGNOSIS — G6289 Other specified polyneuropathies: Secondary | ICD-10-CM | POA: Diagnosis not present

## 2023-07-08 DIAGNOSIS — I700 Atherosclerosis of aorta: Secondary | ICD-10-CM | POA: Diagnosis not present

## 2023-07-08 DIAGNOSIS — E559 Vitamin D deficiency, unspecified: Secondary | ICD-10-CM | POA: Diagnosis not present

## 2023-07-08 DIAGNOSIS — Z0000 Encounter for general adult medical examination without abnormal findings: Secondary | ICD-10-CM | POA: Diagnosis not present

## 2023-07-08 MED ORDER — MUPIROCIN 2 % EX OINT
TOPICAL_OINTMENT | CUTANEOUS | 3 refills | Status: DC
  Filled 2023-07-08: qty 22, 30d supply, fill #0
  Filled 2023-10-28: qty 22, 30d supply, fill #1

## 2023-07-11 ENCOUNTER — Other Ambulatory Visit (HOSPITAL_COMMUNITY): Payer: Self-pay

## 2023-07-11 MED ORDER — ESZOPICLONE 3 MG PO TABS
3.0000 mg | ORAL_TABLET | Freq: Every day | ORAL | 4 refills | Status: DC
  Filled 2023-07-11: qty 30, 30d supply, fill #0
  Filled 2023-08-13 (×2): qty 30, 30d supply, fill #1
  Filled 2023-09-07: qty 30, 30d supply, fill #2
  Filled 2023-10-03 – 2023-10-06 (×2): qty 30, 30d supply, fill #3
  Filled 2023-11-02: qty 30, 30d supply, fill #4

## 2023-07-13 ENCOUNTER — Other Ambulatory Visit (HOSPITAL_COMMUNITY): Payer: Self-pay

## 2023-07-14 ENCOUNTER — Other Ambulatory Visit: Payer: Self-pay

## 2023-08-05 DIAGNOSIS — E785 Hyperlipidemia, unspecified: Secondary | ICD-10-CM | POA: Diagnosis not present

## 2023-08-05 DIAGNOSIS — I10 Essential (primary) hypertension: Secondary | ICD-10-CM | POA: Diagnosis not present

## 2023-08-05 DIAGNOSIS — D649 Anemia, unspecified: Secondary | ICD-10-CM | POA: Diagnosis not present

## 2023-08-10 DIAGNOSIS — M25511 Pain in right shoulder: Secondary | ICD-10-CM | POA: Diagnosis not present

## 2023-08-13 ENCOUNTER — Other Ambulatory Visit (HOSPITAL_COMMUNITY): Payer: Self-pay

## 2023-08-15 ENCOUNTER — Other Ambulatory Visit: Payer: Self-pay

## 2023-08-16 ENCOUNTER — Other Ambulatory Visit: Payer: Self-pay

## 2023-08-17 ENCOUNTER — Other Ambulatory Visit (HOSPITAL_COMMUNITY): Payer: Self-pay

## 2023-08-25 ENCOUNTER — Other Ambulatory Visit (HOSPITAL_COMMUNITY): Payer: Self-pay

## 2023-08-25 MED ORDER — OXYCODONE-ACETAMINOPHEN 5-325 MG PO TABS
1.0000 | ORAL_TABLET | Freq: Four times a day (QID) | ORAL | 0 refills | Status: DC
  Filled 2023-09-12: qty 90, 23d supply, fill #0

## 2023-08-27 ENCOUNTER — Other Ambulatory Visit (HOSPITAL_COMMUNITY): Payer: Self-pay

## 2023-08-29 ENCOUNTER — Inpatient Hospital Stay: Admitting: Hematology & Oncology

## 2023-08-29 ENCOUNTER — Inpatient Hospital Stay: Attending: Hematology & Oncology

## 2023-08-29 ENCOUNTER — Encounter: Payer: Self-pay | Admitting: Hematology & Oncology

## 2023-08-29 ENCOUNTER — Ambulatory Visit (HOSPITAL_BASED_OUTPATIENT_CLINIC_OR_DEPARTMENT_OTHER)
Admission: RE | Admit: 2023-08-29 | Discharge: 2023-08-29 | Disposition: A | Discharge status: Home / Self Care | Source: Ambulatory Visit | Attending: Hematology & Oncology | Admitting: Hematology & Oncology

## 2023-08-29 ENCOUNTER — Other Ambulatory Visit (HOSPITAL_COMMUNITY): Payer: Self-pay

## 2023-08-29 VITALS — BP 146/86 | HR 90 | Temp 98.5°F | Resp 18 | Ht 70.87 in | Wt 199.4 lb

## 2023-08-29 DIAGNOSIS — R002 Palpitations: Secondary | ICD-10-CM | POA: Insufficient documentation

## 2023-08-29 DIAGNOSIS — F17220 Nicotine dependence, chewing tobacco, uncomplicated: Secondary | ICD-10-CM | POA: Diagnosis not present

## 2023-08-29 DIAGNOSIS — I252 Old myocardial infarction: Secondary | ICD-10-CM | POA: Diagnosis not present

## 2023-08-29 DIAGNOSIS — D472 Monoclonal gammopathy: Secondary | ICD-10-CM

## 2023-08-29 DIAGNOSIS — R000 Tachycardia, unspecified: Secondary | ICD-10-CM | POA: Diagnosis not present

## 2023-08-29 LAB — CBC WITH DIFFERENTIAL (CANCER CENTER ONLY)
Abs Immature Granulocytes: 0.03 10*3/uL (ref 0.00–0.07)
Basophils Absolute: 0 10*3/uL (ref 0.0–0.1)
Basophils Relative: 1 %
Eosinophils Absolute: 0.1 10*3/uL (ref 0.0–0.5)
Eosinophils Relative: 1 %
HCT: 32.6 % — ABNORMAL LOW (ref 39.0–52.0)
Hemoglobin: 11.7 g/dL — ABNORMAL LOW (ref 13.0–17.0)
Immature Granulocytes: 0 %
Lymphocytes Relative: 20 %
Lymphs Abs: 1.7 10*3/uL (ref 0.7–4.0)
MCH: 29.5 pg (ref 26.0–34.0)
MCHC: 35.9 g/dL (ref 30.0–36.0)
MCV: 82.3 fL (ref 80.0–100.0)
Monocytes Absolute: 0.6 10*3/uL (ref 0.1–1.0)
Monocytes Relative: 7 %
Neutro Abs: 6.2 10*3/uL (ref 1.7–7.7)
Neutrophils Relative %: 71 %
Platelet Count: 284 10*3/uL (ref 150–400)
RBC: 3.96 MIL/uL — ABNORMAL LOW (ref 4.22–5.81)
RDW: 11.8 % (ref 11.5–15.5)
WBC Count: 8.7 10*3/uL (ref 4.0–10.5)
nRBC: 0 % (ref 0.0–0.2)

## 2023-08-29 LAB — LACTATE DEHYDROGENASE: LDH: 147 U/L (ref 98–192)

## 2023-08-29 LAB — CMP (CANCER CENTER ONLY)
ALT: 29 U/L (ref 0–44)
AST: 16 U/L (ref 15–41)
Albumin: 4.8 g/dL (ref 3.5–5.0)
Alkaline Phosphatase: 62 U/L (ref 38–126)
Anion gap: 10 (ref 5–15)
BUN: 15 mg/dL (ref 6–20)
CO2: 27 mmol/L (ref 22–32)
Calcium: 9.6 mg/dL (ref 8.9–10.3)
Chloride: 88 mmol/L — ABNORMAL LOW (ref 98–111)
Creatinine: 1.07 mg/dL (ref 0.61–1.24)
GFR, Estimated: >60 mL/min (ref >60)
Glucose, Bld: 115 mg/dL — ABNORMAL HIGH (ref 70–99)
Potassium: 3.9 mmol/L (ref 3.5–5.1)
Sodium: 125 mmol/L — ABNORMAL LOW (ref 135–145)
Total Bilirubin: 0.5 mg/dL (ref 0.0–1.2)
Total Protein: 7.3 g/dL (ref 6.5–8.1)

## 2023-08-29 MED ORDER — PRALUENT 150 MG/ML ~~LOC~~ SOAJ
150.0000 mg | SUBCUTANEOUS | 3 refills | Status: AC
Start: 2023-08-29 — End: ?
  Filled 2023-08-29: qty 2, 28d supply, fill #0
  Filled 2023-10-03: qty 2, 28d supply, fill #1
  Filled 2023-10-28: qty 2, 28d supply, fill #2
  Filled 2023-11-25: qty 2, 28d supply, fill #3
  Filled 2023-12-26: qty 2, 28d supply, fill #4
  Filled 2024-01-20: qty 2, 28d supply, fill #5
  Filled 2024-02-20: qty 2, 28d supply, fill #6
  Filled 2024-03-16: qty 2, 28d supply, fill #7

## 2023-08-29 NOTE — Progress Notes (Signed)
 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  HPI: [name] is well-known to me.  He is a very nice 58 year old white male.  Known for several years.  He works in the [employer].  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  He is followed by Dr. Shayne of [facility].  He does have a history of different health issues.  He is on numerous medications.  He has been having some palpitations.  He has been having some tachycardia.  As always, Dr. Shayne is very thorough.  He did some lab work on him.  There was some slight anemia.  However, this is certainly not new.  He had lab work that was done on 08/05/2023.  This showed a white cell count of 5.2.  Hemoglobin 0.4.  Platelet count 232,000.  MCV was 87.  I think he had iron studies that were done which showed an iron saturation of 18%.  He has had normal electrolytes.  There is no problems with respect to renal insufficiency.  I think he may be on some oral iron.  Further studies show that did have a small monoclonal spike.  An SPEP showed an M spike of 0.3 g/dL.  This was found to be an IgG kappa M spike.  He has had no problem with infections.  He has had some small abscesses on his back which have been drained.  He does not smoke.  He has had no problems with COVID.  He has had no bony pain.  Overall, I would say that his performance status is probably ECOG 1.    Past Medical History:  Diagnosis Date   Adenomatous colon polyp    Anal fissure    Arthritis    Chest pain    Depression    GERD (gastroesophageal reflux disease)    Helicobacter pylori (H. pylori)    Hemorrhoids    Hyperlipidemia    Hypertension    Myocardial infarction (HCC) 1999   hypertensive   Peptic ulcer, unspecified site, unspecified as acute or chronic, without mention of hemorrhage, perforation, or obstruction    Pneumonia     Posttraumatic stress disorder \   Renal calculus    hx of   Rhabdomyolysis   :   Past Surgical History:  Procedure Laterality Date   COLONOSCOPY  2015   COLONOSCOPY  11/2016   Jacobs-polyps   HEMORRHOID SURGERY     NASAL SINUS SURGERY     SHOULDER ARTHROSCOPY WITH ROTATOR CUFF REPAIR Right 01/27/2023   Procedure: Right shoulder arthroscopy, debridement, subacromial decompression, distal clavicle resection, rotator cuff repair;  Surgeon: Melita Drivers, MD;  Location: [facility];  Service: Orthopedics;  Laterality: Right;    SHOULDER SURGERY Left    thermal cap   SPHINCTEROTOMY    :   Current Outpatient Medications:    Alirocumab  (PRALUENT ) 150 MG/ML SOAJ, INJECT 150MG  UNDER THE SKIN EVERY 2 WEEKS, Disp: 6 mL, Rfl: 3   B Complex Vitamins (VITAMIN B COMPLEX W/B-12 PO), Take 1 tablet by mouth daily., Disp: , Rfl:    buPROPion  (WELLBUTRIN  SR) 150 MG 12 hr tablet, Take 1 tablet (150 mg total) by mouth in the morning., Disp: 90 tablet, Rfl: 3   Cholecalciferol (VITAMIN D3) 1000 units CAPS, Take 1,000 mcg by mouth daily at 6 (six) AM., Disp: , Rfl:    desvenlafaxine  (  PRISTIQ ) 50 MG 24 hr tablet, Take 1 tablet (50 mg total) by mouth daily., Disp: 90 tablet, Rfl: 3   Eszopiclone  3 MG TABS, Take 1 tablet (3 mg total) by mouth at bedtime., Disp: 30 tablet, Rfl: 4   ezetimibe  (ZETIA ) 10 MG tablet, Take 1 tablet (10 mg total) by mouth daily., Disp: 90 tablet, Rfl: 2   hydrochlorothiazide  (HYDRODIURIL ) 25 MG tablet, Take 1 tablet (25 mg total) by mouth every evening., Disp: 90 tablet, Rfl: 3   labetalol  (NORMODYNE ) 300 MG tablet, Take 1 tablet (300 mg total) by mouth 2 (two) times daily., Disp: 180 tablet, Rfl: 3   loratadine (CLARITIN) 10 MG tablet, Take 10 mg by mouth at bedtime., Disp: , Rfl:    mupirocin  ointment (BACTROBAN ) 2 %, Apply to affected area three time daily to treat or prevent skin infection., Disp: 22 g, Rfl: 3   ondansetron  (ZOFRAN ) 4 MG tablet, Take 1 tablet (4 mg total) by  mouth every 8 (eight) hours as needed for nausea or vomiting., Disp: 10 tablet, Rfl: 0   ondansetron  (ZOFRAN -ODT) 8 MG disintegrating tablet, Dissolve 1 tablet (8 mg total) by mouth up to every 8 (eight) hours as needed for nausea or vomiting, Disp: 20 tablet, Rfl: 4   oxyCODONE -acetaminophen  (PERCOCET/ROXICET) 5-325 MG tablet, Take 1 tablet by mouth every 6 - 8 hours as needed (DNF 09/11/23), Disp: 90 tablet, Rfl: 0   pantoprazole  (PROTONIX ) 40 MG tablet, Take 1 tablet (40 mg total) by mouth daily., Disp: 90 tablet, Rfl: 2   potassium chloride  (KLOR-CON ) 10 MEQ tablet, Take 1 tablet (10 mEq total) by mouth daily with food, Disp: 90 tablet, Rfl: 3   telmisartan  (MICARDIS ) 20 MG tablet, Take 1 tablet (20 mg total) by mouth 2 (two) times daily., Disp: 180 tablet, Rfl: 1   tiZANidine  (ZANAFLEX ) 4 MG tablet, Take 1 tablet (4 mg total) by mouth 3 (three) times daily as needed., Disp: 90 tablet, Rfl: 11   triamcinolone  (NASACORT ) 55 MCG/ACT AERO nasal inhaler, Place 2 sprays into the nose daily as needed (allergies)., Disp: , Rfl: :  :   Allergies  Allergen Reactions   Bio-Statin [Nystatin] Other (See Comments)    Rhabdomylosis   Statins Other (See Comments)    Tri-statin and Bio-statin. Rhabdomylosis   Tricor [Fenofibrate] Other (See Comments)    rhabdomyolysis   Diphenhydramine Hcl Other (See Comments)    Hyperactivity   Latex Itching   Citrucel [Methylcellulose] Diarrhea  :   Family History  Problem Relation Age of Onset   Alzheimer's disease Mother 5       died   Hyperlipidemia Father 88       alive -and back probldems   Colon polyps Father    Thyroid  cancer Father    Aplastic anemia Father    Hematuria Brother 74   Diabetes Paternal Grandmother    Colon polyps Paternal Grandmother    Diabetes Maternal Grandmother    Colon polyps Maternal Grandmother    Colon cancer Neg Hx    Esophageal cancer Neg Hx    Rectal cancer Neg Hx    Stomach cancer Neg Hx   :   Social History    Socioeconomic History   Marital status: Single    Spouse name: Not on file   Number of children: 0   Years of education: Not on file   Highest education level: Not on file  Occupational History   Occupation: works ED    Employer: [employer]  Tobacco Use   Smoking status: Never    Passive exposure: Never   Smokeless tobacco: Current    Types: Chew   Tobacco comments:    pt. chew 2 packs of tobacco a week  Vaping Use   Vaping status: Never Used  Substance and Sexual Activity   Alcohol use: Not Currently    Comment: rare   Drug use: No   Sexual activity: Not on file  Other Topics Concern   Not on file  Social History Narrative   Not on file   Social Drivers of Health   Financial Resource Strain: Not on file  Food Insecurity: Not on file  Transportation Needs: Not on file  Physical Activity: Not on file  Stress: Not on file  Social Connections: Not on file  Intimate Partner Violence: Not At Risk (10/23/2021)   Received from [facility]    Threatened: Not on file    Insulted: Not on file    Physically Hurt : Not on file    Scream: Not on file  :  Review of Systems  Constitutional: Negative.   HENT: Negative.    Eyes: Negative.   Respiratory: Negative.    Cardiovascular: Negative.   Gastrointestinal: Negative.   Genitourinary: Negative.   Musculoskeletal: Negative.   Skin: Negative.   Neurological: Negative.   Endo/Heme/Allergies: Negative.   Psychiatric/Behavioral: Negative.       Exam:  Vital signs are temperature of 98.5.  Pulse 90.  Blood pressure 146/86.  Weight is 199 pounds.  @IPVITALS @ Physical Exam Vitals reviewed.  HENT:     Head: Normocephalic and atraumatic.   Eyes:     Pupils: Pupils are equal, round, and reactive to light.    Cardiovascular:     Rate and Rhythm: Normal rate and regular rhythm.     Heart sounds: Normal heart sounds.  Pulmonary:     Effort: Pulmonary effort is normal.     Breath sounds: Normal  breath sounds.  Abdominal:     General: Bowel sounds are normal.     Palpations: Abdomen is soft.   Musculoskeletal:        General: No tenderness or deformity. Normal range of motion.     Cervical back: Normal range of motion.  Lymphadenopathy:     Cervical: No cervical adenopathy.   Skin:    General: Skin is warm and dry.     Findings: No erythema or rash.   Neurological:     Mental Status: He is alert and oriented to person, place, and time.   Psychiatric:        Behavior: Behavior normal.        Thought Content: Thought content normal.        Judgment: Judgment normal.       Recent Labs    08/29/23 1351  WBC 8.7  HGB 11.7*  HCT 32.6*  PLT 284    Recent Labs    08/29/23 1351  NA 125*  K 3.9  CL 88*  CO2 27  GLUCOSE 115*  BUN 15  CREATININE 1.07  CALCIUM 9.6    Blood smear review: Normochromic and normocytic population of red blood cells.  There are no nucleated red blood cells.  I see no teardrop cells.  There is no target cells.  I see no rouleaux formation.  He has no schistocytes or spherocytes.  White blood cells appear normal in morphology and maturation.  There are no hypersegmented polys.  He has  no plasma cells.  There are no immature myeloid or lymphoid cells.  Platelets are adequate in number and size.  Platelets are well granulated.  Pathology: None    Assessment and Plan: [name] is a very nice 58 year old male.  He has a very small monoclonal spike.  This could certainly be reactive.  I would think that this probably would be reactive given the small nature of the monoclonal protein..  I think will be important for us  to do a 24-hour urine on him.  I think he will be important for us  to do a bone survey on him.  I really do not think that he needs to have a bone marrow biopsy done.  I think this would be incredibly low yield.  Again I would suspect this is going to be some type of reactive monoclonal myopathy.  If so, I think we can just  follow this along.  And we just probably follow this every 6 months for right now.  It was fun talking to Mr. Carstarphen.  I am not seeing him for quite a while.  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  Once I get the results back from his bone survey in the 24-hour urine, then we will figure out when to get him back into the office.

## 2023-08-30 ENCOUNTER — Other Ambulatory Visit (HOSPITAL_COMMUNITY): Payer: Self-pay

## 2023-08-30 LAB — IGG, IGA, IGM
IgA: 111 mg/dL (ref 90–386)
IgG (Immunoglobin G), Serum: 1018 mg/dL (ref 603–1613)
IgM (Immunoglobulin M), Srm: 49 mg/dL (ref 20–172)

## 2023-08-30 LAB — KAPPA/LAMBDA LIGHT CHAINS
Kappa free light chain: 15.9 mg/L (ref 3.3–19.4)
Kappa, lambda light chain ratio: 1.67 — ABNORMAL HIGH (ref 0.26–1.65)
Lambda free light chains: 9.5 mg/L (ref 5.7–26.3)

## 2023-09-02 LAB — PROTEIN ELECTROPHORESIS, SERUM, WITH REFLEX
A/G Ratio: 1.2 (ref 0.7–1.7)
Albumin ELP: 4 g/dL (ref 2.9–4.4)
Alpha-1-Globulin: 0.2 g/dL (ref 0.0–0.4)
Alpha-2-Globulin: 1 g/dL (ref 0.4–1.0)
Beta Globulin: 1.1 g/dL (ref 0.7–1.3)
Gamma Globulin: 0.9 g/dL (ref 0.4–1.8)
Globulin, Total: 3.3 g/dL (ref 2.2–3.9)
M-Spike, %: 0.4 g/dL — ABNORMAL HIGH
SPEP Interpretation: 0
Total Protein ELP: 7.3 g/dL (ref 6.0–8.5)

## 2023-09-02 LAB — IMMUNOFIXATION REFLEX, SERUM
IgA: 111 mg/dL (ref 90–386)
IgG (Immunoglobin G), Serum: 1020 mg/dL (ref 603–1613)
IgM (Immunoglobulin M), Srm: 48 mg/dL (ref 20–172)

## 2023-09-06 ENCOUNTER — Ambulatory Visit: Payer: Self-pay | Admitting: Hematology & Oncology

## 2023-09-06 ENCOUNTER — Encounter: Payer: Self-pay | Admitting: *Deleted

## 2023-09-06 ENCOUNTER — Inpatient Hospital Stay: Attending: Hematology & Oncology

## 2023-09-06 DIAGNOSIS — I252 Old myocardial infarction: Secondary | ICD-10-CM | POA: Insufficient documentation

## 2023-09-06 DIAGNOSIS — D472 Monoclonal gammopathy: Secondary | ICD-10-CM | POA: Diagnosis not present

## 2023-09-06 DIAGNOSIS — F17220 Nicotine dependence, chewing tobacco, uncomplicated: Secondary | ICD-10-CM | POA: Diagnosis not present

## 2023-09-06 DIAGNOSIS — R000 Tachycardia, unspecified: Secondary | ICD-10-CM | POA: Diagnosis not present

## 2023-09-06 DIAGNOSIS — R002 Palpitations: Secondary | ICD-10-CM | POA: Diagnosis not present

## 2023-09-08 ENCOUNTER — Other Ambulatory Visit: Payer: Self-pay

## 2023-09-08 LAB — UPEP/UIFE/LIGHT CHAINS/TP, 24-HR UR
% BETA, Urine: 0 %
ALPHA 1 URINE: 0 %
Albumin, U: 100 %
Alpha 2, Urine: 0 %
Free Kappa Lt Chains,Ur: 6.25 mg/L (ref 1.17–86.46)
Free Kappa/Lambda Ratio: 6.38 (ref 1.83–14.26)
Free Lambda Lt Chains,Ur: 0.98 mg/L (ref 0.27–15.21)
GAMMA GLOBULIN URINE: 0 %
Total Protein, Urine-Ur/day: 161 mg/(24.h) — ABNORMAL HIGH (ref 30–150)
Total Protein, Urine: 5.2 mg/dL
Total Volume: 3100

## 2023-09-12 ENCOUNTER — Other Ambulatory Visit (HOSPITAL_COMMUNITY): Payer: Self-pay

## 2023-09-12 ENCOUNTER — Encounter: Payer: Self-pay | Admitting: *Deleted

## 2023-10-04 ENCOUNTER — Other Ambulatory Visit (HOSPITAL_COMMUNITY): Payer: Self-pay

## 2023-10-04 ENCOUNTER — Other Ambulatory Visit: Payer: Self-pay

## 2023-10-04 DIAGNOSIS — F1120 Opioid dependence, uncomplicated: Secondary | ICD-10-CM | POA: Diagnosis not present

## 2023-10-04 DIAGNOSIS — G5701 Lesion of sciatic nerve, right lower limb: Secondary | ICD-10-CM | POA: Diagnosis not present

## 2023-10-04 MED ORDER — OXYCODONE-ACETAMINOPHEN 5-325 MG PO TABS
1.0000 | ORAL_TABLET | Freq: Four times a day (QID) | ORAL | 0 refills | Status: DC
  Filled 2023-10-11: qty 90, fill #0
  Filled 2023-11-07 – 2023-11-11 (×2): qty 90, 23d supply, fill #0

## 2023-10-04 MED ORDER — OXYCODONE-ACETAMINOPHEN 5-325 MG PO TABS
1.0000 | ORAL_TABLET | ORAL | 0 refills | Status: DC
  Filled 2023-10-10: qty 90, fill #0
  Filled 2023-10-12: qty 90, 23d supply, fill #0

## 2023-10-04 MED ORDER — METHOCARBAMOL 500 MG PO TABS
500.0000 mg | ORAL_TABLET | Freq: Three times a day (TID) | ORAL | 2 refills | Status: DC
  Filled 2023-10-04: qty 90, 30d supply, fill #0
  Filled 2023-10-30: qty 90, 30d supply, fill #1
  Filled 2023-11-29: qty 90, 30d supply, fill #2

## 2023-10-04 MED ORDER — OXYCODONE-ACETAMINOPHEN 5-325 MG PO TABS
1.0000 | ORAL_TABLET | Freq: Four times a day (QID) | ORAL | 0 refills | Status: DC
  Filled 2023-12-08: qty 90, 30d supply, fill #0
  Filled 2023-12-09: qty 90, fill #0
  Filled 2023-12-10: qty 90, 23d supply, fill #0

## 2023-10-05 ENCOUNTER — Other Ambulatory Visit: Payer: Self-pay

## 2023-10-05 ENCOUNTER — Other Ambulatory Visit (HOSPITAL_COMMUNITY): Payer: Self-pay

## 2023-10-10 ENCOUNTER — Other Ambulatory Visit (HOSPITAL_COMMUNITY): Payer: Self-pay

## 2023-10-11 ENCOUNTER — Other Ambulatory Visit: Payer: Self-pay

## 2023-10-11 ENCOUNTER — Other Ambulatory Visit (HOSPITAL_COMMUNITY): Payer: Self-pay

## 2023-10-11 MED ORDER — TELMISARTAN 20 MG PO TABS
20.0000 mg | ORAL_TABLET | Freq: Two times a day (BID) | ORAL | 1 refills | Status: DC
  Filled 2023-10-11: qty 180, 90d supply, fill #0
  Filled 2024-01-20: qty 180, 90d supply, fill #1

## 2023-10-12 ENCOUNTER — Other Ambulatory Visit: Payer: Self-pay

## 2023-10-12 ENCOUNTER — Other Ambulatory Visit (HOSPITAL_COMMUNITY): Payer: Self-pay

## 2023-10-27 ENCOUNTER — Other Ambulatory Visit (HOSPITAL_COMMUNITY): Payer: Self-pay

## 2023-10-27 MED ORDER — HYDROCHLOROTHIAZIDE 25 MG PO TABS
25.0000 mg | ORAL_TABLET | Freq: Every evening | ORAL | 3 refills | Status: DC
  Filled 2023-10-27 – 2024-01-03 (×2): qty 90, 90d supply, fill #0

## 2023-10-28 ENCOUNTER — Other Ambulatory Visit (HOSPITAL_COMMUNITY): Payer: Self-pay

## 2023-10-29 ENCOUNTER — Other Ambulatory Visit (HOSPITAL_COMMUNITY): Payer: Self-pay

## 2023-10-31 ENCOUNTER — Other Ambulatory Visit (HOSPITAL_COMMUNITY): Payer: Self-pay

## 2023-11-03 ENCOUNTER — Other Ambulatory Visit: Payer: Self-pay

## 2023-11-07 ENCOUNTER — Other Ambulatory Visit: Payer: Self-pay

## 2023-11-11 ENCOUNTER — Other Ambulatory Visit (HOSPITAL_COMMUNITY): Payer: Self-pay

## 2023-11-25 ENCOUNTER — Other Ambulatory Visit: Payer: Self-pay

## 2023-11-25 ENCOUNTER — Other Ambulatory Visit (HOSPITAL_COMMUNITY): Payer: Self-pay

## 2023-11-25 MED ORDER — POTASSIUM CHLORIDE ER 10 MEQ PO TBCR
10.0000 meq | EXTENDED_RELEASE_TABLET | Freq: Every day | ORAL | 3 refills | Status: AC
  Filled 2023-11-25 – 2024-01-25 (×2): qty 90, 90d supply, fill #0
  Filled 2024-02-04: qty 90, 90d supply, fill #1

## 2023-11-25 MED ORDER — LABETALOL HCL 300 MG PO TABS
300.0000 mg | ORAL_TABLET | Freq: Two times a day (BID) | ORAL | 3 refills | Status: DC
  Filled 2023-11-25: qty 180, 90d supply, fill #0
  Filled 2024-02-26: qty 180, 90d supply, fill #1

## 2023-11-28 ENCOUNTER — Other Ambulatory Visit (HOSPITAL_COMMUNITY): Payer: Self-pay

## 2023-11-29 ENCOUNTER — Other Ambulatory Visit (HOSPITAL_COMMUNITY): Payer: Self-pay

## 2023-11-29 MED ORDER — ESZOPICLONE 3 MG PO TABS
3.0000 mg | ORAL_TABLET | Freq: Every day | ORAL | 3 refills | Status: DC
  Filled 2023-11-29 – 2023-12-01 (×2): qty 30, 30d supply, fill #0
  Filled 2023-12-26 – 2023-12-28 (×3): qty 30, 30d supply, fill #1
  Filled 2024-01-29: qty 30, 30d supply, fill #2
  Filled 2024-02-26: qty 30, 30d supply, fill #3

## 2023-12-01 ENCOUNTER — Other Ambulatory Visit (HOSPITAL_COMMUNITY): Payer: Self-pay

## 2023-12-07 ENCOUNTER — Other Ambulatory Visit: Payer: Self-pay

## 2023-12-08 ENCOUNTER — Other Ambulatory Visit: Payer: Self-pay

## 2023-12-08 ENCOUNTER — Other Ambulatory Visit (HOSPITAL_COMMUNITY): Payer: Self-pay

## 2023-12-09 ENCOUNTER — Other Ambulatory Visit: Payer: Self-pay

## 2023-12-09 ENCOUNTER — Other Ambulatory Visit (HOSPITAL_COMMUNITY): Payer: Self-pay

## 2023-12-10 ENCOUNTER — Other Ambulatory Visit (HOSPITAL_COMMUNITY): Payer: Self-pay

## 2023-12-23 ENCOUNTER — Other Ambulatory Visit (HOSPITAL_COMMUNITY): Payer: Self-pay

## 2023-12-23 MED ORDER — FLUZONE 0.5 ML IM SUSY
0.5000 mL | PREFILLED_SYRINGE | INTRAMUSCULAR | 0 refills | Status: DC
  Filled 2023-12-23: qty 0.5, 1d supply, fill #0

## 2023-12-27 ENCOUNTER — Encounter: Payer: Self-pay | Admitting: Pharmacist

## 2023-12-27 ENCOUNTER — Other Ambulatory Visit: Payer: Self-pay

## 2023-12-27 ENCOUNTER — Other Ambulatory Visit (HOSPITAL_COMMUNITY): Payer: Self-pay

## 2023-12-28 ENCOUNTER — Other Ambulatory Visit: Payer: Self-pay

## 2023-12-28 ENCOUNTER — Ambulatory Visit (HOSPITAL_BASED_OUTPATIENT_CLINIC_OR_DEPARTMENT_OTHER)
Admission: EM | Admit: 2023-12-28 | Discharge: 2023-12-28 | Disposition: A | Discharge status: Home / Self Care | Attending: Physician Assistant | Admitting: Physician Assistant

## 2023-12-28 DIAGNOSIS — L089 Local infection of the skin and subcutaneous tissue, unspecified: Secondary | ICD-10-CM

## 2023-12-28 MED ORDER — DOXYCYCLINE HYCLATE 100 MG PO CAPS: 100.0000 mg | ORAL_CAPSULE | Freq: Two times a day (BID) | ORAL | 0 refills | Status: DC

## 2023-12-28 MED ORDER — MUPIROCIN 2 % EX OINT: 1.0000 | TOPICAL_OINTMENT | Freq: Two times a day (BID) | CUTANEOUS | 0 refills | Status: DC

## 2023-12-28 NOTE — ED Provider Notes (Signed)
 [facility]    CSN: [id number] Arrival date & time: 12/28/23  1719      History   Chief Complaint Chief Complaint  Patient presents with   Abscess    HPI IFEANYICHUKWU WICKHAM is a 58 y.o. male.   Patient presents today with a pustule on his right hip.  He has a history of recurrent wounds that have turned into large abscesses requiring surgical intervention.  He reports that the pain is rated 7 on a 0-10 pain scale, worse with even light touch such as his clothes touching this lesion, described as sharp, no relieving factors notified.  He denies any fever, nausea, vomiting.  He was last treated for a similar episode several years ago.  Denies any recent antibiotics in the past 9 days.  He has not tried any over-the-counter medication for symptom management.    Past Medical History:  Diagnosis Date   Adenomatous colon polyp    Anal fissure    Arthritis    Chest pain    Depression    GERD (gastroesophageal reflux disease)    Helicobacter pylori (H. pylori)    Hemorrhoids    Hyperlipidemia    Hypertension    Myocardial infarction (HCC) 1999   hypertensive   Peptic ulcer, unspecified site, unspecified as acute or chronic, without mention of hemorrhage, perforation, or obstruction    Pneumonia    Posttraumatic stress disorder \   Renal calculus    hx of   Rhabdomyolysis     Patient Active Problem List   Diagnosis Date Noted   HYPERLIPIDEMIA 06/10/2008   POST TRAUMATIC STRESS SYNDROME 06/10/2008   DEPRESSION 06/10/2008   Essential hypertension 06/10/2008   Peptic ulcer 06/10/2008   RHABDOMYOLYSIS 06/10/2008   History of cardiovascular disorder 06/10/2008   RENAL CALCULUS, HX OF 06/10/2008    Past Surgical History:  Procedure Laterality Date   COLONOSCOPY  2015   COLONOSCOPY  11/2016   Jacobs-polyps   HEMORRHOID SURGERY     NASAL SINUS SURGERY     SHOULDER ARTHROSCOPY WITH ROTATOR CUFF REPAIR Right 01/27/2023   Procedure: Right shoulder arthroscopy,  debridement, subacromial decompression, distal clavicle resection, rotator cuff repair;  Surgeon: Melita Drivers, MD;  Location: [facility];  Service: Orthopedics;  Laterality: Right;    SHOULDER SURGERY Left    thermal cap   SPHINCTEROTOMY         Home Medications    Prior to Admission medications   Medication Sig Start Date End Date Taking? Authorizing Provider  Alirocumab  (PRALUENT ) 150 MG/ML SOAJ INJECT 150MG  UNDER THE SKIN EVERY 2 WEEKS 08/29/23  Yes   B Complex Vitamins (VITAMIN B COMPLEX W/B-12 PO) Take 1 tablet by mouth daily.   Yes [provider]  Cholecalciferol (VITAMIN D3) 1000 units CAPS Take 1,000 mcg by mouth daily at 6 (six) AM.   Yes [provider]  desvenlafaxine  (PRISTIQ ) 50 MG 24 hr tablet Take 1 tablet (50 mg total) by mouth daily. 06/30/23  Yes   doxycycline (VIBRAMYCIN) 100 MG capsule Take 1 capsule (100 mg total) by mouth 2 (two) times daily. 12/28/23  Yes Naveed Humphres K, PA-C  Eszopiclone  3 MG TABS Take 1 tablet (3 mg total) by mouth immediately before bedtime as directed. 11/29/23  Yes   ezetimibe  (ZETIA ) 10 MG tablet Take 1 tablet (10 mg total) by mouth daily. 04/08/23  Yes   hydrochlorothiazide  (HYDRODIURIL ) 25 MG tablet Take 1 tablet (25 mg total) by mouth every evening. 10/27/23  Yes   influenza vac split trivalent PF (FLUZONE) 0.5 ML injection Inject 0.5 mLs into the muscle. 12/23/23  Yes Luiz Channel, MD  labetalol  (NORMODYNE ) 300 MG tablet Take 1 tablet (300 mg total) by mouth 2 (two) times daily. 11/25/23  Yes   loratadine (CLARITIN) 10 MG tablet Take 10 mg by mouth at bedtime.   Yes [provider]  methocarbamol  (ROBAXIN ) 500 MG tablet Take 1 tablet (500 mg total) by mouth 3 (three) times daily. 10/04/23  Yes   mupirocin  ointment (BACTROBAN ) 2 % Apply 1 Application topically 2 (two) times daily. 12/28/23  Yes Hogan Hoobler K, PA-C  ondansetron  (ZOFRAN ) 4 MG tablet Take 1 tablet (4 mg total) by mouth every 8 (eight) hours as  needed for nausea or vomiting. 01/27/23  Yes Shuford, Randine, PA-C  ondansetron  (ZOFRAN -ODT) 8 MG disintegrating tablet Dissolve 1 tablet (8 mg total) by mouth up to every 8 (eight) hours as needed for nausea or vomiting 11/24/21  Yes   oxyCODONE -acetaminophen  (PERCOCET/ROXICET) 5-325 MG tablet Take 1 tablet by mouth every 6 (six) to 8 (eight) hours as needed. 10/04/23  Yes   pantoprazole  (PROTONIX ) 40 MG tablet Take 1 tablet (40 mg total) by mouth daily. 04/08/23  Yes   potassium chloride  (KLOR-CON ) 10 MEQ tablet Take 1 tablet (10 mEq total) by mouth daily with food. 11/25/23  Yes   telmisartan  (MICARDIS ) 20 MG tablet Take 1 tablet (20 mg total) by mouth 2 (two) times daily. 10/11/23  Yes   tiZANidine  (ZANAFLEX ) 4 MG tablet Take 1 tablet (4 mg total) by mouth 3 (three) times daily as needed. 01/17/23  Yes   triamcinolone  (NASACORT ) 55 MCG/ACT AERO nasal inhaler Place 2 sprays into the nose daily as needed (allergies).   Yes [provider]  buPROPion  (WELLBUTRIN  SR) 150 MG 12 hr tablet Take 1 tablet (150 mg total) by mouth in the morning. 04/01/23     oxyCODONE -acetaminophen  (PERCOCET/ROXICET) 5-325 MG tablet Take 1 tablet by oral route  every 6-8 hours as needed (DNF 11/11/23) 11/11/23     oxyCODONE -acetaminophen  (PERCOCET/ROXICET) 5-325 MG tablet Take 1 tablet by mouth every 6-8 hours as needed 10/04/23       Family History Family History  Problem Relation Age of Onset   Alzheimer's disease Mother 42       died   Hyperlipidemia Father 51       alive -and back probldems   Colon polyps Father    Thyroid  cancer Father    Aplastic anemia Father    Hematuria Brother 22   Diabetes Paternal Grandmother    Colon polyps Paternal Grandmother    Diabetes Maternal Grandmother    Colon polyps Maternal Grandmother    Colon cancer Neg Hx    Esophageal cancer Neg Hx    Rectal cancer Neg Hx    Stomach cancer Neg Hx     Social History Social History   Tobacco Use   Smoking status: Never     Passive exposure: Never   Smokeless tobacco: Current    Types: Chew   Tobacco comments:    pt. chew 2 packs of tobacco a week  Vaping Use   Vaping status: Never Used  Substance Use Topics   Alcohol use: Not Currently    Comment: rare   Drug use: No     Allergies   Bio-statin [nystatin], Statins, Tricor [fenofibrate], Diphenhydramine hcl, Latex, and Citrucel [methylcellulose]   Review of Systems Review of Systems  Constitutional:  Positive for activity  change. Negative for appetite change, fatigue and fever.  Gastrointestinal:  Negative for nausea and vomiting.  Musculoskeletal:  Negative for arthralgias and myalgias.  Skin:  Positive for color change. Negative for wound.  Neurological:  Negative for dizziness, light-headedness and headaches.     Physical Exam Triage Vital Signs ED Triage Vitals  Encounter Vitals Group     BP 12/28/23 2017 (!) 156/98     Girls Systolic BP Percentile --      Girls Diastolic BP Percentile --      Boys Systolic BP Percentile --      Boys Diastolic BP Percentile --      Pulse Rate 12/28/23 2017 78     Resp 12/28/23 2017 17     Temp 12/28/23 2017 98 F (36.7 C)     Temp Source 12/28/23 2017 Oral     SpO2 12/28/23 2017 97 %     Weight --      Height --      Head Circumference --      Peak Flow --      Pain Score 12/28/23 2014 7     Pain Loc --      Pain Education --      Exclude from Growth Chart --    No data found.  Updated Vital Signs BP (!) 156/98 (BP Location: Right Arm)   Pulse 78   Temp 98 F (36.7 C) (Oral)   Resp 17   SpO2 97%   Visual Acuity Right Eye Distance:   Left Eye Distance:   Bilateral Distance:    Right Eye Near:   Left Eye Near:    Bilateral Near:     Physical Exam Vitals reviewed.  Constitutional:      General: He is awake.     Appearance: Normal appearance. He is well-developed. He is not ill-appearing.     Comments: Very pleasant male appears stated age in no acute distress sitting  comfortably in exam room  HENT:     Head: Normocephalic and atraumatic.  Cardiovascular:     Rate and Rhythm: Normal rate and regular rhythm.     Heart sounds: Normal heart sounds, S1 normal and S2 normal. No murmur heard. Pulmonary:     Effort: Pulmonary effort is normal.     Breath sounds: Normal breath sounds. No stridor. No wheezing, rhonchi or rales.     Comments: Clear to auscultation bilaterally Skin:    Findings: Rash present. Rash is pustular.         Comments: 4 cm x 2 cm pustule noted right lower back with associated induration but without fluctuance.  No bleeding or drainage noted.  No streaking or evidence of lymphangitis.  Neurological:     Mental Status: He is alert.  Psychiatric:        Behavior: Behavior is cooperative.      UC Treatments / Results  Labs (all labs ordered are listed, but only abnormal results are displayed) Labs Reviewed - No data to display  EKG   Radiology No results found.  Procedures Procedures (including critical care time)  Medications Ordered in UC Medications - No data to display  Initial Impression / Assessment and Plan / UC Course  I have reviewed the triage vital signs and the nursing notes.  Pertinent labs & imaging results that were available during my care of the patient were reviewed by me and considered in my medical decision making (see chart for details).  Patient is well-appearing, afebrile, nontoxic, nontachycardic.  No indication for I&D as there is no significant fluctuance or obvious fluid collection on exam.  He was encouraged to use warm compresses.  Will start doxycycline 100 mg twice daily.  We discussed that he is to avoid prolonged sun exposure while on this medication due to associated photosensitivity.  Recommend that he keep the area clean and apply Bactroban  ointment with dressing changes.  If he has any rapid enlargement of lesion, streaking, abnormal drainage or bleeding, fever, increasing pain he  needs to be seen emergently.  Strict return precautions given.  Patient declined excuse note.  Final Clinical Impressions(s) / UC Diagnoses   Final diagnoses:  Skin infection     Discharge Instructions      Start doxycycline 100 mg twice daily for up to 2 weeks.  You can stop this medication after 7 or 10 days if your symptoms improve.  Stay out of the sun while on this medication.  Use a warm compress to encourage drainage.  Apply Bactroban  ointment daily with dressing change.  If you develop any enlarging lesion, increasing pain, fever, nausea, vomiting you need to be seen immediately as we discussed.    ED Prescriptions     Medication Sig Dispense Auth. Provider   doxycycline (VIBRAMYCIN) 100 MG capsule Take 1 capsule (100 mg total) by mouth 2 (two) times daily. 28 capsule Huntleigh Doolen K, PA-C   mupirocin  ointment (BACTROBAN ) 2 % Apply 1 Application topically 2 (two) times daily. 22 g Abron Neddo K, PA-C      PDMP not reviewed this encounter.   Sherrell Rocky POUR, PA-C 12/28/23 2055

## 2023-12-28 NOTE — ED Triage Notes (Signed)
 Pt states he has an abscess on his right hip, appeared last night. Sore hurts tender to touch redness. Has a history of them in the past.

## 2023-12-28 NOTE — Discharge Instructions (Signed)
 Start doxycycline 100 mg twice daily for up to 2 weeks.  You can stop this medication after 7 or 10 days if your symptoms improve.  Stay out of the sun while on this medication.  Use a warm compress to encourage drainage.  Apply Bactroban  ointment daily with dressing change.  If you develop any enlarging lesion, increasing pain, fever, nausea, vomiting you need to be seen immediately as we discussed.

## 2024-01-02 ENCOUNTER — Other Ambulatory Visit: Payer: Self-pay

## 2024-01-02 ENCOUNTER — Other Ambulatory Visit (HOSPITAL_COMMUNITY): Payer: Self-pay

## 2024-01-02 DIAGNOSIS — M47816 Spondylosis without myelopathy or radiculopathy, lumbar region: Secondary | ICD-10-CM | POA: Diagnosis not present

## 2024-01-02 DIAGNOSIS — G5701 Lesion of sciatic nerve, right lower limb: Secondary | ICD-10-CM | POA: Diagnosis not present

## 2024-01-02 DIAGNOSIS — F1120 Opioid dependence, uncomplicated: Secondary | ICD-10-CM | POA: Diagnosis not present

## 2024-01-02 MED ORDER — CYCLOBENZAPRINE HCL 10 MG PO TABS
10.0000 mg | ORAL_TABLET | Freq: Every evening | ORAL | 3 refills | Status: DC | PRN
  Filled 2024-01-02 (×2): qty 180, 90d supply, fill #0

## 2024-01-02 MED ORDER — OXYCODONE-ACETAMINOPHEN 5-325 MG PO TABS
1.0000 | ORAL_TABLET | Freq: Four times a day (QID) | ORAL | 0 refills | Status: DC | PRN
  Filled 2024-01-03 – 2024-01-06 (×2): qty 90, 23d supply, fill #0

## 2024-01-02 MED ORDER — OXYCODONE-ACETAMINOPHEN 5-325 MG PO TABS
1.0000 | ORAL_TABLET | Freq: Four times a day (QID) | ORAL | 0 refills | Status: DC | PRN
  Filled 2024-02-04 – 2024-02-05 (×2): qty 90, 23d supply, fill #0

## 2024-01-02 MED ORDER — OXYCODONE-ACETAMINOPHEN 5-325 MG PO TABS
1.0000 | ORAL_TABLET | Freq: Four times a day (QID) | ORAL | 0 refills | Status: AC | PRN
  Filled 2024-03-05: qty 90, 23d supply, fill #0

## 2024-01-03 ENCOUNTER — Other Ambulatory Visit (HOSPITAL_COMMUNITY): Payer: Self-pay

## 2024-01-03 ENCOUNTER — Other Ambulatory Visit: Payer: Self-pay

## 2024-01-06 ENCOUNTER — Other Ambulatory Visit (HOSPITAL_COMMUNITY): Payer: Self-pay

## 2024-01-16 DIAGNOSIS — M720 Palmar fascial fibromatosis [Dupuytren]: Secondary | ICD-10-CM | POA: Diagnosis not present

## 2024-01-20 ENCOUNTER — Other Ambulatory Visit (HOSPITAL_COMMUNITY): Payer: Self-pay

## 2024-01-23 ENCOUNTER — Other Ambulatory Visit (HOSPITAL_COMMUNITY): Payer: Self-pay

## 2024-01-23 ENCOUNTER — Other Ambulatory Visit: Payer: Self-pay

## 2024-01-23 MED ORDER — EZETIMIBE 10 MG PO TABS
10.0000 mg | ORAL_TABLET | Freq: Every day | ORAL | 1 refills | Status: AC
  Filled 2024-01-23 (×2): qty 90, 90d supply, fill #0

## 2024-01-23 MED ORDER — PANTOPRAZOLE SODIUM 40 MG PO TBEC
40.0000 mg | DELAYED_RELEASE_TABLET | Freq: Every day | ORAL | 3 refills | Status: AC
  Filled 2024-01-23 (×2): qty 90, 90d supply, fill #0

## 2024-01-26 ENCOUNTER — Other Ambulatory Visit (HOSPITAL_COMMUNITY): Payer: Self-pay

## 2024-01-30 ENCOUNTER — Other Ambulatory Visit: Payer: Self-pay

## 2024-02-04 ENCOUNTER — Other Ambulatory Visit (HOSPITAL_COMMUNITY): Payer: Self-pay

## 2024-02-05 ENCOUNTER — Other Ambulatory Visit (HOSPITAL_COMMUNITY): Payer: Self-pay

## 2024-02-06 ENCOUNTER — Other Ambulatory Visit (HOSPITAL_COMMUNITY): Payer: Self-pay

## 2024-02-14 ENCOUNTER — Other Ambulatory Visit: Payer: Self-pay

## 2024-02-14 ENCOUNTER — Other Ambulatory Visit (HOSPITAL_COMMUNITY): Payer: Self-pay

## 2024-02-14 MED ORDER — AMLODIPINE BESYLATE 5 MG PO TABS
5.0000 mg | ORAL_TABLET | Freq: Every day | ORAL | 3 refills | Status: AC
  Filled 2024-02-14: qty 90, 90d supply, fill #0

## 2024-02-16 ENCOUNTER — Other Ambulatory Visit (HOSPITAL_COMMUNITY): Payer: Self-pay

## 2024-02-16 MED ORDER — XIAFLEX 0.9 MG IJ SOLR
0.9000 mg | INTRAMUSCULAR | 0 refills | Status: DC
  Filled 2024-02-20: qty 1, fill #0

## 2024-02-20 ENCOUNTER — Other Ambulatory Visit (HOSPITAL_COMMUNITY): Payer: Self-pay

## 2024-02-20 ENCOUNTER — Other Ambulatory Visit: Payer: Self-pay

## 2024-02-27 ENCOUNTER — Other Ambulatory Visit: Payer: Self-pay

## 2024-02-27 ENCOUNTER — Other Ambulatory Visit (HOSPITAL_COMMUNITY): Payer: Self-pay

## 2024-02-29 ENCOUNTER — Other Ambulatory Visit: Payer: Self-pay

## 2024-02-29 NOTE — Progress Notes (Signed)
 [facility] representative [name]. reached out regarding possible override for [name]  to be filled there as we are no longer able to fill per [name]. [name] is out of office so provided her with [name] phone number to reach out directly for potential override. Informed her that if this cannot be obtained directly she can call back after 1/2 when Chase returns.

## 2024-03-06 ENCOUNTER — Other Ambulatory Visit (HOSPITAL_COMMUNITY): Payer: Self-pay

## 2024-03-16 ENCOUNTER — Other Ambulatory Visit: Payer: Self-pay

## 2024-03-16 ENCOUNTER — Encounter: Payer: Self-pay | Admitting: Cardiovascular Disease

## 2024-03-16 ENCOUNTER — Ambulatory Visit

## 2024-03-16 ENCOUNTER — Encounter (HOSPITAL_COMMUNITY): Payer: Self-pay | Admitting: Pharmacist

## 2024-03-16 ENCOUNTER — Ambulatory Visit: Attending: Cardiovascular Disease | Admitting: Cardiovascular Disease

## 2024-03-16 ENCOUNTER — Other Ambulatory Visit (HOSPITAL_COMMUNITY): Payer: Self-pay

## 2024-03-16 VITALS — BP 122/72 | Ht 71.75 in | Wt 204.0 lb

## 2024-03-16 DIAGNOSIS — I700 Atherosclerosis of aorta: Secondary | ICD-10-CM | POA: Insufficient documentation

## 2024-03-16 DIAGNOSIS — E782 Mixed hyperlipidemia: Secondary | ICD-10-CM | POA: Diagnosis not present

## 2024-03-16 DIAGNOSIS — R000 Tachycardia, unspecified: Secondary | ICD-10-CM

## 2024-03-16 DIAGNOSIS — I10 Essential (primary) hypertension: Secondary | ICD-10-CM

## 2024-03-16 DIAGNOSIS — R55 Syncope and collapse: Secondary | ICD-10-CM | POA: Diagnosis not present

## 2024-03-16 MED ORDER — CARVEDILOL 12.5 MG PO TABS
12.5000 mg | ORAL_TABLET | Freq: Two times a day (BID) | ORAL | 3 refills | Status: AC
  Filled 2024-03-16 (×2): qty 180, 90d supply, fill #0

## 2024-03-16 MED ORDER — HYDROCHLOROTHIAZIDE 12.5 MG PO CAPS
12.5000 mg | ORAL_CAPSULE | Freq: Every day | ORAL | 3 refills | Status: AC
  Filled 2024-03-16 (×2): qty 90, 90d supply, fill #0

## 2024-03-16 MED ORDER — TELMISARTAN 20 MG PO TABS
20.0000 mg | ORAL_TABLET | Freq: Two times a day (BID) | ORAL | 1 refills | Status: AC
  Filled 2024-03-16: qty 180, 90d supply, fill #0

## 2024-03-16 MED ORDER — ESZOPICLONE 3 MG PO TABS
3.0000 mg | ORAL_TABLET | Freq: Every day | ORAL | 3 refills | Status: AC
  Filled 2024-03-24 – 2024-03-26 (×2): qty 30, 30d supply, fill #0
  Filled 2024-03-29: qty 30, 30d supply, fill #1

## 2024-03-16 NOTE — Progress Notes (Unsigned)
 Enrolled patient for a 7 day Zio XT monitor to be mailed to patients home.

## 2024-03-16 NOTE — Patient Instructions (Addendum)
 Medication Instructions:   Decrease hydrochlorothiazide  ( hydrochlorothiazide  ) to 12.5 mg  daily    Stop taking Labetalol   Start taking Carvedilol  12.5 mg twice a day     *If you need a refill on your cardiac medications before your next appointment, please call your pharmacy*   Lab Work: Not needed    Testing/Procedures: 1)Your physician has requested that you have an echocardiogram. Echocardiography is a painless test that uses sound waves to create images of your heart. It provides your doctor with information about the size and shape of your heart and how well your hearts chambers and valves are working. This procedure takes approximately one hour. There are no restrictions for this procedure. Please do NOT wear cologne, perfume, aftershave, or lotions (deodorant is allowed). Please arrive 15 minutes prior to your appointment time.  Please note: We ask at that you not bring children with you during ultrasound (echo/ vascular) testing. Due to room size and safety concerns, children are not allowed in the ultrasound rooms during exams. Our front office staff cannot provide observation of children in our lobby area while testing is being conducted. An adult accompanying a patient to their appointment will only be allowed in the ultrasound room at the discretion of the ultrasound technician under special circumstances. We apologize for any inconvenience.    2) Your physician has recommended that you wear a holter monitor 7 day Zio . Holter monitors are medical devices that record the hearts electrical activity. Doctors most often use these monitors to diagnose arrhythmias. Arrhythmias are problems with the speed or rhythm of the heartbeat. The monitor is a small, portable device. You can wear one while you do your normal daily activities. This is usually used to diagnose what is causing palpitations/syncope (passing out).   Follow-Up: At [facility], you and your health needs are  our priority.  As part of our continuing mission to provide you with exceptional heart care, we have created designated Provider Care Teams.  These Care Teams include your primary Cardiologist (physician) and Advanced Practice Providers (APPs -  Physician Assistants and Nurse Practitioners) who all work together to provide you with the care you need, when you need it.     Your next appointment:   3 month(s)  The format for your next appointment:   In Person  Provider:   Jerel Balding, MD  You have been referred to [facility] - pharmacy for blood pressure in 2-3 weeks    Other Instructions   ZIO XT- Long Term Monitor Instructions  Your physician has requested you wear a ZIO patch monitor for 7 days.  This is a single patch monitor. [employer] supplies one patch monitor per enrollment. Additional stickers are not available. Please do not apply patch if you will be having a Nuclear Stress Test,  Echocardiogram, Cardiac CT, MRI, or Chest Xray during the period you would be wearing the  monitor. The patch cannot be worn during these tests. You cannot remove and re-apply the  ZIO XT patch monitor.  Your ZIO patch monitor will be mailed 3 day USPS to your address on file. It may take 3-5 days  to receive your monitor after you have been enrolled.  Once you have received your monitor, please review the enclosed instructions. Your monitor  has already been registered assigning a specific monitor serial # to you.  Billing and Patient Assistance Program Information  We have supplied [employer] with any of your insurance information on file for billing purposes.  [employer] offers a sliding scale Patient Assistance Program for patients that do not have  insurance, or whose insurance does not completely cover the cost of the ZIO monitor.  You must apply for the Patient Assistance Program to qualify for this discounted rate.  To apply, please call [employer] at [phone], select option 4, select option 2, ask  to apply for  Patient Assistance Program. Meredeth will ask your household income, and how many people  are in your household. They will quote your out-of-pocket cost based on that information.  [employer] will also be able to set up a 58-month, interest-free payment plan if needed.  Applying the monitor   Shave hair from upper left chest.  Hold abrader disc by orange tab. Rub abrader in 40 strokes over the upper left chest as  indicated in your monitor instructions.  Clean area with 4 enclosed alcohol pads. Let dry.  Apply patch as indicated in monitor instructions. Patch will be placed under collarbone on left  side of chest with arrow pointing upward.  Rub patch adhesive wings for 2 minutes. Remove white label marked 1. Remove the white  label marked 2. Rub patch adhesive wings for 2 additional minutes.  While looking in a mirror, press and release button in center of patch. A small green light will  flash 3-4 times. This will be your only indicator that the monitor has been turned on.  Do not shower for the first 24 hours. You may shower after the first 24 hours.  Press the button if you feel a symptom. You will hear a small click. Record Date, Time and  Symptom in the Patient Logbook.  When you are ready to remove the patch, follow instructions on the last 2 pages of Patient  Logbook. Stick patch monitor onto the last page of Patient Logbook.  Place Patient Logbook in the blue and white box. Use locking tab on box and tape box closed  securely. The blue and white box has prepaid postage on it. Please place it in the mailbox as  soon as possible. Your physician should have your test results approximately 7 days after the  monitor has been mailed back to [facility].  Call [facility] Customer Care at [phone] if you have questions regarding  your ZIO XT patch monitor. Call them immediately if you see an orange light blinking on your  monitor.  If your monitor falls off in  less than 4 days, contact our Monitor department at [phone].  If your monitor becomes loose or falls off after 4 days call [employer] at [phone] for  suggestions on securing your monitor

## 2024-03-16 NOTE — Progress Notes (Signed)
 " Cardiology Office Note   Date:  03/16/2024  ID:  [name], DOB Jan 02, 1966, MRN [id number] PCP: Shayne Anes, MD  [facility] Providers Cardiologist:  None     History of Present Illness [name] is a 59 y.o. male with hypertension who presents with episodes of near syncope and sinus tachycardia in consultation from Dr. Shayne.  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  He experiences episodes of near syncope characterized by marked hypotension and a rapid heart rate, often reaching 150 bpm. These episodes occur without a specific pattern and can happen while sitting, lying down, or standing. They resolve spontaneously after a few hours. He stopped wearing his Garmin watch due to frequent alerts about his rapid heart rate. No chest pain, shortness of breath, or palpitations during these episodes.  Printouts from his personal ECG monitoring device appear to simply show sinus tachycardia around 130-150 bpm.  He has a history of hypertension, previously difficult to control, with diastolic readings around 90-100 mmHg. His current regimen includes hydrochlorothiazide , amlodipine , labetalol , and telmisartan , which has stabilized his blood pressure to the 120s/50s range. He recalls a hypertensive crisis in 2009 with a diastolic pressure of 130 mmHg, leading to hospitalization and a normal cardiac catheterization and normal echocardiogram.  He has not had any dedicated evaluation for renal artery stenosis that I can identify, but a CT of the abdomen and pelvis with contrast performed in August 2020 shows normal kidney sizes and on my review no evidence of renal artery stenosis and minimal aortic atherosclerotic plaque.  On a CT angiogram of the chest in 2009 there is no visible atherosclerotic plaque in the distribution of the coronary arteries or in the thoracic aorta.  He has hyperlipidemia managed with a PCSK9 inhibitor and Zetia . He is  intolerant to statins due to a past episode of rhabdomyolysis (which occurred while taking a combination of statin and fenofibrate).  Most recent lipid profile from April 2025 shows an excellent cholesterol profile with total cholesterol 136, HDL 55, LDL 46 and minimally elevated triglycerides at 175.  He does not have diabetes mellitus.  He has normal renal function.  His family history includes high blood pressure, with his mother having had TIAs and his father passing away from aplastic anemia. He has a brother with high cholesterol.  He is scheduled for hand surgery for trigger finger and has a history of a broken wrist. He holds a degree in diet medicine.    Studies Reviewed EKG Interpretation Date/Time:  Friday March 16 2024 13:33:30 EST Ventricular Rate:  89 PR Interval:  172 QRS Duration:  98 QT Interval:  384 QTC Calculation: 467 R Axis:   -21  Text Interpretation: Normal sinus rhythm Nonspecific ST abnormality When compared with ECG of 24-Jan-2023 14:28, No significant change was found Confirmed by Krystena Reitter [phone]) on 03/16/2024 2:01:56 PM    Records from February 2009 show a normal echocardiogram:  -  Overall left ventricular systolic function was normal. Left         ventricular ejection fraction was estimated , range being 55         % to 65 %.    and a normal coronary angiogram performed by Dr. Ozell Fell:   HEMODYNAMIC DATA:  1. The central aortic pressure was 121/85, mean 103.  2. Left ventricular pressure 128/10.  3. There was no gradient pullback across the aortic valve.  ANGIOGRAPHIC DATA:  1. Ventriculography was done in the RAO projection.  There appeared to      be very mild global hypokinesis, it did not appear to be severe.      Estimated ejection fraction is in the 50-55% range.  2. The left main is fairly short and trifurcates into an LAD, ramus,      and an AV circumflex.  The AV circumflex itself is a codominant      system.  The left  main and trifurcation are without critical      disease.  3. The left anterior descending artery courses to the apex and      provides a couple of small diagonal branches.  No definite      angiographic abnormalities are seen or high obstruction observed.  4. The ramus intermedius is a large bifurcating vessel without      significant disease.  5. The AV circumflex provides a moderate-sized marginal branch,      posterolateral branch, and then a co-PDA.  This appears without      critical narrowing.  6. The right coronary artery is a moderate-sized vessel with a single      PDA.  It is without significant disease.    CONCLUSION:  1. No significant high-grade focal obstruction.  2. Low normal ejection fraction.   Risk Assessment/Calculations           Physical Exam VS:  BP 122/72 (BP Location: Left Arm, Patient Position: Sitting, Cuff Size: Normal)   Ht 5' 11.75 (1.822 m)   Wt 204 lb (92.5 kg)   SpO2 97%   BMI 27.86 kg/m        Wt Readings from Last 3 Encounters:  03/16/24 204 lb (92.5 kg)  08/29/23 199 lb 6.4 oz (90.4 kg)  01/27/23 188 lb (85.3 kg)    GEN: Well nourished, well developed in no acute distress.  Overweight NECK: No JVD; No carotid bruits CARDIAC: RRR, no murmurs, rubs, gallops RESPIRATORY:  Clear to auscultation without rales, wheezing or rhonchi  ABDOMEN: Soft, non-tender, non-distended EXTREMITIES:  No edema; No deformity   ASSESSMENT AND PLAN Essential hypertension: Appears to be well-controlled with current medications, but may be excessively so.  I suspect that his episodes of tachycardia actually represent compensatory sinus tachycardia for hypotension.  He is almost exclusively on vasodilator medications of 1 type or another: The labetalol  is primarily a vasodilator alpha-blocker with very minimal beta-blocker effect.  He is also on diuretics.  This is probably a recipe for reflex tachycardia. Reduced hydrochlorothiazide  to half the current dose,  discontinue labetalol , replacing it with carvedilol  12.5 mg twice daily.  Scheduled follow-up with pharmacy team for blood pressure titration in 2-3 weeks.  Also asked him to keep a detailed daily log of his blood pressure and bring it to all his appointments.  He does not appear to have renal artery stenosis, based on review of previous imaging studies. Tachycardia with near syncope: His personal electrocardiographic monitoring device appears to just show sinus tachycardia, but will confirm with a medical grade wearable monitor.  Will check an echocardiogram to exclude structural heart disease, but his physical exam does not show abnormalities. Hyperlipidemia: Minimally elevated triglycerides with excellent cholesterol levels.  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  Plan to continue PCSK9 inhibitor and ezetimibe .  Minimal aortic atherosclerosis is seen in the abdominal aorta, without any visible plaque in the distribution of the coronary arteries or renal arteries.  Dispo: Reduce hydrochlorothiazide  to 12.5 mg daily.  Stop labetalol .  Start carvedilol  12.5 mg twice daily.  Keep a daily blood pressure log.  7-day arrhythmia monitor.  Echocardiogram.  Follow-up with Pharm.D. for adjustment of blood pressure medications.  Signed, Jerel Balding, MD   "

## 2024-03-19 ENCOUNTER — Other Ambulatory Visit (HOSPITAL_COMMUNITY): Payer: Self-pay

## 2024-03-19 ENCOUNTER — Other Ambulatory Visit: Payer: Self-pay

## 2024-03-20 ENCOUNTER — Other Ambulatory Visit (HOSPITAL_COMMUNITY): Payer: Self-pay

## 2024-03-20 MED ORDER — OXYCODONE-ACETAMINOPHEN 5-325 MG PO TABS: 1.0000 | ORAL_TABLET | Freq: Four times a day (QID) | ORAL | 0 refills | Status: AC | PRN

## 2024-03-20 MED ORDER — OXYCODONE-ACETAMINOPHEN 5-325 MG PO TABS
1.0000 | ORAL_TABLET | Freq: Four times a day (QID) | ORAL | 0 refills | Status: AC | PRN
  Filled 2024-04-05: qty 7, 2d supply, fill #0
  Filled 2024-04-05: qty 83, 21d supply, fill #0
  Filled ????-??-??: fill #0

## 2024-03-24 ENCOUNTER — Other Ambulatory Visit (HOSPITAL_COMMUNITY): Payer: Self-pay

## 2024-03-25 ENCOUNTER — Other Ambulatory Visit (HOSPITAL_COMMUNITY): Payer: Self-pay

## 2024-03-26 ENCOUNTER — Other Ambulatory Visit: Payer: Self-pay

## 2024-03-26 ENCOUNTER — Other Ambulatory Visit (HOSPITAL_COMMUNITY): Payer: Self-pay

## 2024-03-27 ENCOUNTER — Other Ambulatory Visit (HOSPITAL_COMMUNITY): Payer: Self-pay

## 2024-03-28 ENCOUNTER — Other Ambulatory Visit (HOSPITAL_COMMUNITY): Payer: Self-pay

## 2024-03-28 ENCOUNTER — Other Ambulatory Visit: Payer: Self-pay

## 2024-03-28 MED ORDER — BUPROPION HCL ER (SR) 150 MG PO TB12
150.0000 mg | ORAL_TABLET | Freq: Every morning | ORAL | 3 refills | Status: AC
  Filled 2024-03-28: qty 90, 90d supply, fill #0

## 2024-03-28 MED ORDER — CYCLOBENZAPRINE HCL 10 MG PO TABS
10.0000 mg | ORAL_TABLET | Freq: Every evening | ORAL | 3 refills | Status: AC | PRN
  Filled 2024-03-28: qty 180, 90d supply, fill #0

## 2024-03-29 ENCOUNTER — Other Ambulatory Visit (HOSPITAL_COMMUNITY): Payer: Self-pay

## 2024-04-04 ENCOUNTER — Other Ambulatory Visit (HOSPITAL_COMMUNITY): Payer: Self-pay

## 2024-04-05 ENCOUNTER — Other Ambulatory Visit (HOSPITAL_COMMUNITY): Payer: Self-pay

## 2024-04-10 ENCOUNTER — Ambulatory Visit: Payer: Self-pay | Admitting: Cardiovascular Disease

## 2024-04-10 DIAGNOSIS — R000 Tachycardia, unspecified: Secondary | ICD-10-CM

## 2024-04-20 ENCOUNTER — Ambulatory Visit (HOSPITAL_COMMUNITY)

## 2024-04-27 ENCOUNTER — Ambulatory Visit: Admitting: Pharmacist

## 2024-06-15 ENCOUNTER — Ambulatory Visit: Admitting: Cardiovascular Disease
